# Patient Record
Sex: Female | Born: 1965
Health system: Southern US, Community
[De-identification: ages and names within clinical notes are randomized; demographics above are authoritative.]

## PROBLEM LIST (undated history)

## (undated) DIAGNOSIS — M1990 Unspecified osteoarthritis, unspecified site: Secondary | ICD-10-CM

## (undated) DIAGNOSIS — K219 Gastro-esophageal reflux disease without esophagitis: Secondary | ICD-10-CM

## (undated) DIAGNOSIS — Z8742 Personal history of other diseases of the female genital tract: Secondary | ICD-10-CM

## (undated) HISTORY — PX: CYST EXCISION: SHX5701

## (undated) HISTORY — DX: Gastro-esophageal reflux disease without esophagitis: K21.9

## (undated) HISTORY — DX: Unspecified osteoarthritis, unspecified site: M19.90

## (undated) HISTORY — PX: CHOLECYSTECTOMY: SHX55

## (undated) HISTORY — DX: Personal history of other diseases of the female genital tract: Z87.42

## (undated) HISTORY — PX: WISDOM TOOTH EXTRACTION: SHX21

---

## 1998-01-10 ENCOUNTER — Other Ambulatory Visit: Admission: RE | Admit: 1998-01-10 | Discharge: 1998-01-10 | Payer: Self-pay | Admitting: Obstetrics and Gynecology

## 1998-01-12 ENCOUNTER — Ambulatory Visit (HOSPITAL_COMMUNITY): Admission: RE | Admit: 1998-01-12 | Discharge: 1998-01-12 | Payer: Self-pay | Admitting: Obstetrics and Gynecology

## 1999-02-23 ENCOUNTER — Inpatient Hospital Stay (HOSPITAL_COMMUNITY): Admission: AD | Admit: 1999-02-23 | Discharge: 1999-02-26 | Payer: Self-pay | Admitting: Obstetrics and Gynecology

## 1999-03-12 ENCOUNTER — Encounter (HOSPITAL_COMMUNITY): Admission: RE | Admit: 1999-03-12 | Discharge: 1999-06-10 | Payer: Self-pay | Admitting: Obstetrics and Gynecology

## 2001-02-17 ENCOUNTER — Other Ambulatory Visit: Admission: RE | Admit: 2001-02-17 | Discharge: 2001-02-17 | Payer: Self-pay | Admitting: Obstetrics and Gynecology

## 2002-03-17 ENCOUNTER — Inpatient Hospital Stay (HOSPITAL_COMMUNITY): Admission: AD | Admit: 2002-03-17 | Discharge: 2002-03-19 | Payer: Self-pay | Admitting: Obstetrics and Gynecology

## 2002-05-03 ENCOUNTER — Other Ambulatory Visit: Admission: RE | Admit: 2002-05-03 | Discharge: 2002-05-03 | Payer: Self-pay | Admitting: Obstetrics and Gynecology

## 2003-05-24 ENCOUNTER — Other Ambulatory Visit: Admission: RE | Admit: 2003-05-24 | Discharge: 2003-05-24 | Payer: Self-pay | Admitting: Obstetrics and Gynecology

## 2004-09-29 ENCOUNTER — Other Ambulatory Visit: Admission: RE | Admit: 2004-09-29 | Discharge: 2004-09-29 | Payer: Self-pay | Admitting: Obstetrics and Gynecology

## 2006-02-26 ENCOUNTER — Other Ambulatory Visit: Admission: RE | Admit: 2006-02-26 | Discharge: 2006-02-26 | Payer: Self-pay | Admitting: Obstetrics and Gynecology

## 2010-08-29 ENCOUNTER — Emergency Department (HOSPITAL_COMMUNITY)
Admission: EM | Admit: 2010-08-29 | Discharge: 2010-08-29 | Payer: Self-pay | Source: Home / Self Care | Admitting: Emergency Medicine

## 2010-08-29 ENCOUNTER — Ambulatory Visit (HOSPITAL_COMMUNITY): Admission: AD | Admit: 2010-08-29 | Discharge: 2010-08-30 | Payer: Self-pay | Source: Home / Self Care

## 2010-08-30 ENCOUNTER — Encounter (INDEPENDENT_AMBULATORY_CARE_PROVIDER_SITE_OTHER): Payer: Self-pay

## 2010-11-17 LAB — COMPREHENSIVE METABOLIC PANEL
ALT: 12 U/L (ref 0–35)
Albumin: 4.1 g/dL (ref 3.5–5.2)
Alkaline Phosphatase: 51 U/L (ref 39–117)
Alkaline Phosphatase: 54 U/L (ref 39–117)
Calcium: 9.8 mg/dL (ref 8.4–10.5)
Chloride: 108 mEq/L (ref 96–112)
Creatinine, Ser: 0.71 mg/dL (ref 0.4–1.2)
GFR calc Af Amer: >60 mL/min (ref >60)
Glucose, Bld: 118 mg/dL — ABNORMAL HIGH (ref 70–99)
Sodium: 136 mEq/L (ref 135–145)
Total Bilirubin: 0.5 mg/dL (ref 0.3–1.2)
Total Protein: 7.3 g/dL (ref 6.0–8.3)

## 2010-11-17 LAB — URINALYSIS, ROUTINE W REFLEX MICROSCOPIC
Glucose, UA: NEGATIVE mg/dL
Hgb urine dipstick: NEGATIVE
Nitrite: NEGATIVE
Protein, ur: NEGATIVE mg/dL
Urobilinogen, UA: 0.2 mg/dL (ref 0.0–1.0)

## 2010-11-17 LAB — CBC
Hemoglobin: 12.3 g/dL (ref 12.0–15.0)
Hemoglobin: 13 g/dL (ref 12.0–15.0)
MCH: 31.1 pg (ref 26.0–34.0)
MCH: 31.9 pg (ref 26.0–34.0)
MCHC: 33.5 g/dL (ref 30.0–36.0)
MCHC: 34.1 g/dL (ref 30.0–36.0)
MCV: 93.5 fL (ref 78.0–100.0)
RBC: 4.18 MIL/uL (ref 3.87–5.11)
RDW: 13 % (ref 11.5–15.5)
WBC: 7.5 10*3/uL (ref 4.0–10.5)

## 2010-11-17 LAB — DIFFERENTIAL
Eosinophils Relative: 2 % (ref 0–5)
Lymphs Abs: 1.9 10*3/uL (ref 0.7–4.0)
Neutrophils Relative %: 66 % (ref 43–77)

## 2010-11-17 LAB — POCT PREGNANCY, URINE: Preg Test, Ur: NEGATIVE

## 2011-01-23 NOTE — Discharge Summary (Signed)
[facility]  Patient:    Julie Juarez, Julie Juarez Visit Number: [id number] MRN: [id number]          Service Type: OBS Location: 910A 9101 01 Attending Physician:  Esmeralda Arthur Dictated by:   Saverio Danker, C.N.M. Admit Date:  03/17/2002 Discharge Date: 03/19/2002                             Discharge Summary  ADMITTING DIAGNOSES:          1. Intrauterine pregnancy at term.                               2. Elevated blood pressures.                               3. Thrombocytopenia.                               4. Favorable cervix.  DISCHARGE DIAGNOSES:          1. Intrauterine pregnancy at term.                               2. Elevated blood pressures.                               3. Thrombocytopenia, resolving.                               4. Favorable cervix.                               5. Status post normal spontaneous vaginal                                  delivery.                               6. Breast-feeding.                               7. Plans Micronor for contraception.  PROCEDURE:                    Precipitous vaginal delivery with viable female infant named Beau, who weighed 5 pounds 11 ounces and had Apgars of 9 and 9, attended by Dr. Cherly Hensen, on March 17, 2002.  HOSPITAL COURSE:              Julie Juarez is a 45 year old, married, white female, gravida 3, para 1-0-1-1-1, at 37-1/7 week, admitted for induction secondary to elevated blood pressures and thrombocytopenia.  She did not have proteinuria, but her platelet count was 89.  Her uric acid was 6.2.  She was admitted and underwent AROM and started on Pitocin augmentation.  She progressed rapidly in labor from admission to delivery at 1543 p.m. precipitously and attended by another physician, Dr. Cherly Hensen, for a viable female infant  named Beau, who weighed 5 pounds 11 ounces and had Apgars of 9 and 9.  Please see operative note for details.  Postoperatively, the patient  was maintained on magnesium sulfate for 24 hours following delivery.  Her thrombocytopenia improved from 89 to 103.  The rest of her vital signs were stable.  She was afebrile and since discontinuing her magnesium, her blood pressures have remained stable at 102-120 systolically over 68-72 diastolically.  She has no headaches, blurred vision, or shortness of breath. She is breast-feeding without difficulty, and she desires Micronor for contraception.  She is deemed ready for discharge today.  DISCHARGE INSTRUCTIONS:       As per the [facility] handout.  DISCHARGE MEDICATIONS:        1. Motrin 600 mg p.o. q.6h. p.r.n. pain.                               2. Micronor 1 p.o. q.d. to start in about 2-3                                  weeks.  DISCHARGE LABORATORY DATA:    Her hemoglobin is 9.8, platelets 103.  AST is 24; ALT is 19, and her WBC count is 10.1.  DISCHARGE FOLLOW-UP:          In six weeks at [facility] or p.r.n. Dictated by:   Vance Gather Duplantis, C.N.M. Attending Physician:  Esmeralda Arthur DD:  03/19/02 TD:  03/21/02 Job: [id number]

## 2011-01-23 NOTE — H&P (Signed)
[facility]  Patient:    Julie Juarez, Julie Juarez Visit Number: [id number] MRN: [id number]          Service Type: OBS Location: 910B 9166 01 Attending Physician:  Esmeralda Arthur Dictated by:   Saverio Danker, C.N.M. Admit Date:  03/17/2002                           History and Physical  HISTORY OF PRESENT ILLNESS:   The patient is a 45 year old married white female, gravida 3, para 1-0-1-1 at 37-1/7 weeks.  She presents for induction of labor secondary to preeclampsia with thrombocytopenia.  She denies any nausea, vomiting, headaches or visual disturbances.  She has been recently having blood pressure elevations of 130 to 140s/90 since 34 weeks; these have been managed with rest at home.  However, today her platelet count is 89.  She reports occasional uterine contractions.  She denies any nausea, vomiting, headaches or visual disturbances.  She denies any leaking or vaginal bleeding. Her pregnancy has been followed at [facility] by the M.D. service, and has been essentially uncomplicated to date, other than the elevations in blood pressures since about 34 weeks.  Other history of significance is a history of dysplastic nevi, history of uterine prolapse, and history of frequent UTIs.  Group B strep is not currently available.  OB-GYN HISTORY:               She is a gravida 3, para 1-0-1-1 who had a miscarriage in 1999, with no complications.  She had a vaginal delivery in June 2000 of a viable female infant, weighing 6 pounds 14 ounces at [redacted] weeks gestation with an epidural.  She was attended in delivery by Dr. Estanislado Pandy.  OTHER GYN HISTORY:            History of uterine prolapse.  GENERAL MEDICAL HISTORY:      She has no known drug allergies.  She reports having had the usual childhood diseases.  She has occasional history of anemia in the past, occasional urinary tract infections.  PAST SURGICAL HISTORY:        Two impacted wisdom  teeth in 1989.  Cyst removed from her left shoulder in 1996.  FAMILY HISTORY:               Significant for father and paternal grandmother with hypertension.  Maternal grandmother with adult-onset diabetes (insulin-dependent).  Paternal grandfather with CVA.  First cousin with epilepsy.  GENETIC HISTORY:              Negative.  SOCIAL HISTORY:               She is married to [employer], who is involved and supportive.  They are of the [employer] [facility] faith.  They are both employed full time.  They deny any illicit drug use, alcohol or smoking with this pregnancy.  PRENATAL LABS:                Blood type A positive.  Antibody screen is negative.  Syphilis is nonreactive.  Rubella is positive.  Hepatitis B surface antigen negative.  GC and chlamydia both negative.  Pap smear within normal limits.  One-hour glucola within normal range.  PHYSICAL EXAMINATION:  VITAL SIGNS:                  Blood pressure 130-140/90.  She is afebrile.  HEENT:  Grossly within normal limits.  HEART:                        Regular rate and rhythm.  CHEST:                        Clear.  BREASTS:                      Soft and nontender.  ABDOMEN:                      Gravid with irregular, mild uterine contractions.  Her fetal heart rate is reactive and reassuring.  Her cervix on admission is 2-3, 50%, posterior vertex and -2.  EXTREMITIES:                  Within normal limits.  ASSESSMENT:                   1. Intrauterine pregnancy at term.                               2. PIH with thrombocytopenia.                               3. Favorable cervix.  PLAN:                         Admit to labor and delivery for induction of labor, per Dr. Dois Davenport Rivard. Dictated by:   Vance Gather Duplantis, C.N.M. Attending Physician:  Esmeralda Arthur DD:  03/17/02 TD:  03/17/02 Job: [id number]

## 2011-08-27 ENCOUNTER — Other Ambulatory Visit: Payer: Self-pay | Admitting: Obstetrics and Gynecology

## 2011-08-27 DIAGNOSIS — Z1231 Encounter for screening mammogram for malignant neoplasm of breast: Secondary | ICD-10-CM

## 2011-09-28 ENCOUNTER — Ambulatory Visit: Payer: Self-pay

## 2011-10-05 ENCOUNTER — Ambulatory Visit: Payer: Self-pay

## 2011-12-30 ENCOUNTER — Ambulatory Visit
Admission: RE | Admit: 2011-12-30 | Discharge: 2011-12-30 | Disposition: A | Discharge status: Home / Self Care | Payer: BC Managed Care – PPO | Source: Ambulatory Visit | Attending: Obstetrics and Gynecology | Admitting: Obstetrics and Gynecology

## 2011-12-30 DIAGNOSIS — Z1231 Encounter for screening mammogram for malignant neoplasm of breast: Secondary | ICD-10-CM

## 2012-01-04 ENCOUNTER — Other Ambulatory Visit: Payer: Self-pay | Admitting: Obstetrics and Gynecology

## 2012-01-04 DIAGNOSIS — R928 Other abnormal and inconclusive findings on diagnostic imaging of breast: Secondary | ICD-10-CM

## 2012-01-12 ENCOUNTER — Other Ambulatory Visit: Payer: Self-pay | Admitting: Obstetrics and Gynecology

## 2012-01-12 ENCOUNTER — Ambulatory Visit
Admission: RE | Admit: 2012-01-12 | Discharge: 2012-01-12 | Disposition: A | Discharge status: Home / Self Care | Payer: BC Managed Care – PPO | Source: Ambulatory Visit | Attending: Obstetrics and Gynecology | Admitting: Obstetrics and Gynecology

## 2012-01-12 DIAGNOSIS — R928 Other abnormal and inconclusive findings on diagnostic imaging of breast: Secondary | ICD-10-CM

## 2012-08-30 ENCOUNTER — Ambulatory Visit: Payer: Self-pay | Admitting: Obstetrics and Gynecology

## 2013-03-21 ENCOUNTER — Other Ambulatory Visit: Payer: Self-pay

## 2013-03-21 DIAGNOSIS — Z1231 Encounter for screening mammogram for malignant neoplasm of breast: Secondary | ICD-10-CM

## 2013-04-19 ENCOUNTER — Ambulatory Visit
Admission: RE | Admit: 2013-04-19 | Discharge: 2013-04-19 | Disposition: A | Discharge status: Home / Self Care | Payer: 59 | Source: Ambulatory Visit

## 2013-04-19 ENCOUNTER — Other Ambulatory Visit: Payer: Self-pay | Admitting: Obstetrics and Gynecology

## 2013-04-19 DIAGNOSIS — R928 Other abnormal and inconclusive findings on diagnostic imaging of breast: Secondary | ICD-10-CM

## 2013-04-19 DIAGNOSIS — Z1231 Encounter for screening mammogram for malignant neoplasm of breast: Secondary | ICD-10-CM

## 2013-05-09 ENCOUNTER — Ambulatory Visit
Admission: RE | Admit: 2013-05-09 | Discharge: 2013-05-09 | Disposition: A | Discharge status: Home / Self Care | Payer: 59 | Source: Ambulatory Visit | Attending: Obstetrics and Gynecology | Admitting: Obstetrics and Gynecology

## 2013-05-09 DIAGNOSIS — R928 Other abnormal and inconclusive findings on diagnostic imaging of breast: Secondary | ICD-10-CM

## 2014-11-30 ENCOUNTER — Other Ambulatory Visit: Payer: Self-pay

## 2014-11-30 DIAGNOSIS — Z1231 Encounter for screening mammogram for malignant neoplasm of breast: Secondary | ICD-10-CM

## 2014-12-12 ENCOUNTER — Ambulatory Visit: Payer: Self-pay

## 2014-12-24 ENCOUNTER — Ambulatory Visit: Payer: Self-pay

## 2015-01-28 ENCOUNTER — Ambulatory Visit
Admission: RE | Admit: 2015-01-28 | Discharge: 2015-01-28 | Disposition: A | Discharge status: Home / Self Care | Payer: 59 | Source: Ambulatory Visit

## 2015-01-28 DIAGNOSIS — Z1231 Encounter for screening mammogram for malignant neoplasm of breast: Secondary | ICD-10-CM

## 2016-03-19 ENCOUNTER — Other Ambulatory Visit: Payer: Self-pay | Admitting: Obstetrics and Gynecology

## 2016-03-19 DIAGNOSIS — Z1231 Encounter for screening mammogram for malignant neoplasm of breast: Secondary | ICD-10-CM

## 2016-03-25 ENCOUNTER — Ambulatory Visit
Admission: RE | Admit: 2016-03-25 | Discharge: 2016-03-25 | Disposition: A | Discharge status: Home / Self Care | Payer: 59 | Source: Ambulatory Visit | Attending: Obstetrics and Gynecology | Admitting: Obstetrics and Gynecology

## 2016-03-25 DIAGNOSIS — Z1231 Encounter for screening mammogram for malignant neoplasm of breast: Secondary | ICD-10-CM

## 2017-03-23 DIAGNOSIS — L821 Other seborrheic keratosis: Secondary | ICD-10-CM | POA: Diagnosis not present

## 2017-03-23 DIAGNOSIS — D225 Melanocytic nevi of trunk: Secondary | ICD-10-CM | POA: Diagnosis not present

## 2017-03-23 DIAGNOSIS — D2261 Melanocytic nevi of right upper limb, including shoulder: Secondary | ICD-10-CM | POA: Diagnosis not present

## 2017-04-21 DIAGNOSIS — Z6823 Body mass index (BMI) 23.0-23.9, adult: Secondary | ICD-10-CM | POA: Diagnosis not present

## 2017-04-21 DIAGNOSIS — Z0000 Encounter for general adult medical examination without abnormal findings: Secondary | ICD-10-CM | POA: Diagnosis not present

## 2017-04-21 DIAGNOSIS — Z1389 Encounter for screening for other disorder: Secondary | ICD-10-CM | POA: Diagnosis not present

## 2017-07-07 DIAGNOSIS — Z23 Encounter for immunization: Secondary | ICD-10-CM | POA: Diagnosis not present

## 2017-12-31 IMAGING — MG 2D DIGITAL SCREENING BILATERAL MAMMOGRAM WITH CAD AND ADJUNCT TO
9 of 13 series · 9 of 29 positions shown · non-contrast
Comparison: Previous exam(s).

CLINICAL DATA: Screening.

EXAM:
2D DIGITAL SCREENING BILATERAL MAMMOGRAM WITH CAD AND ADJUNCT TOMO

[L XCCL]
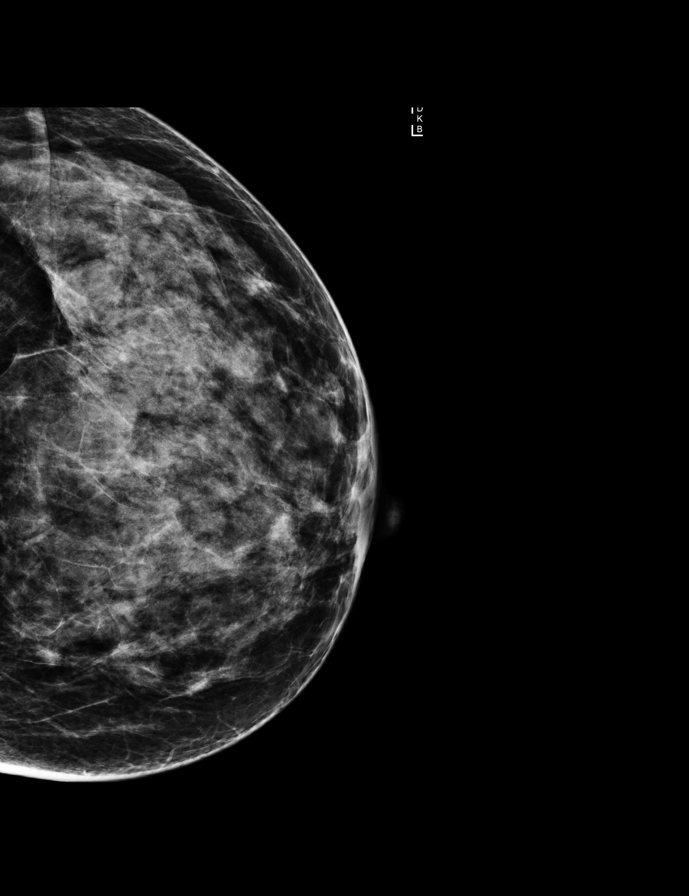

[L CC synth-2D]
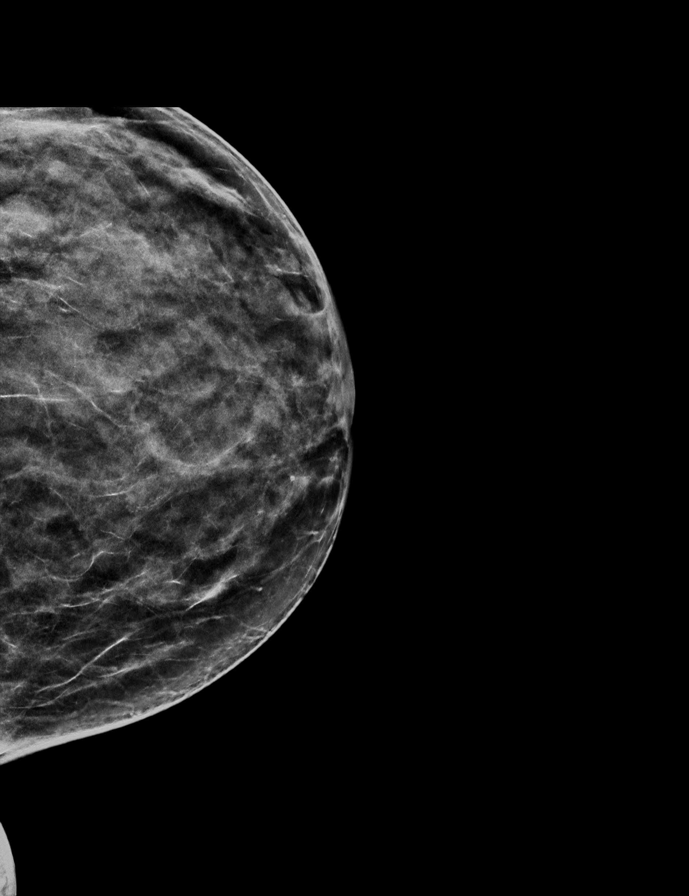

[L MLO]
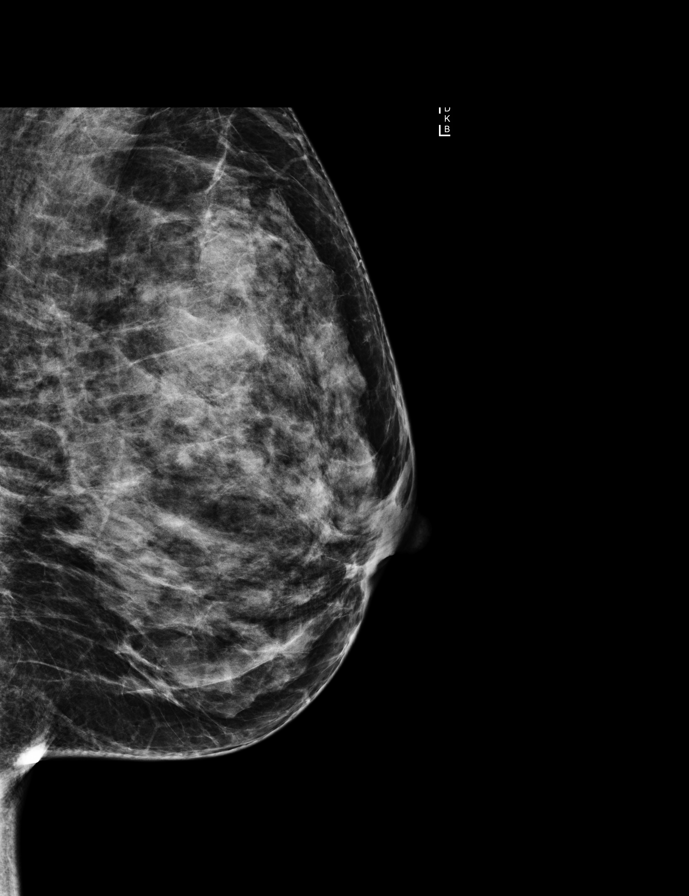

[R CC]
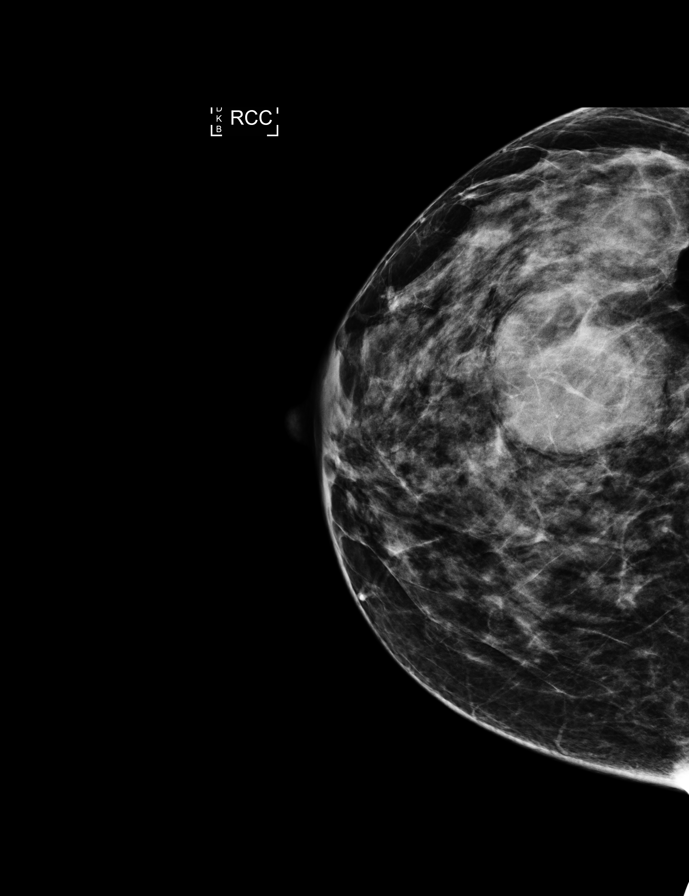

[L MLO synth-2D]
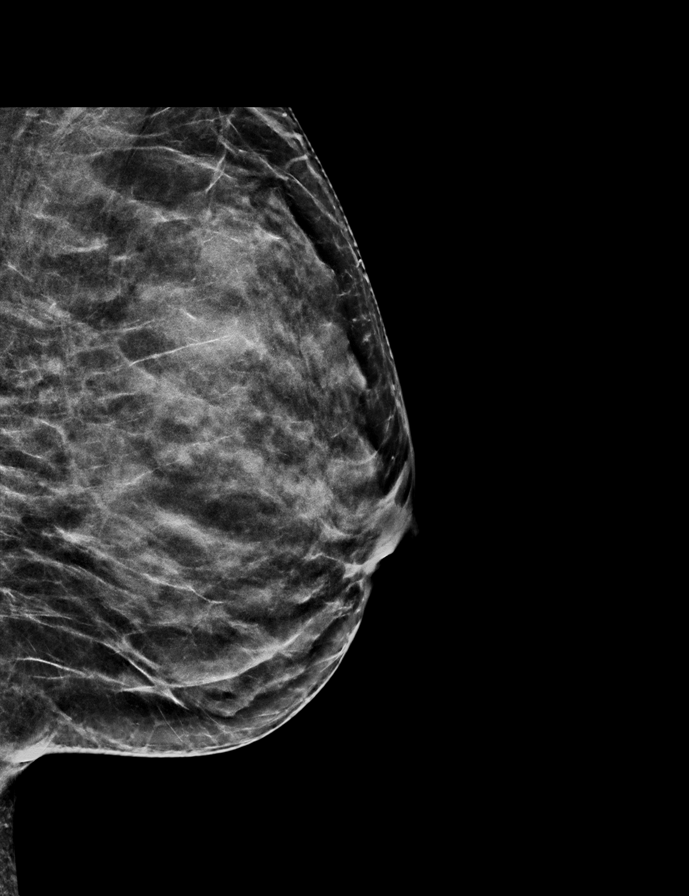

[L CC]
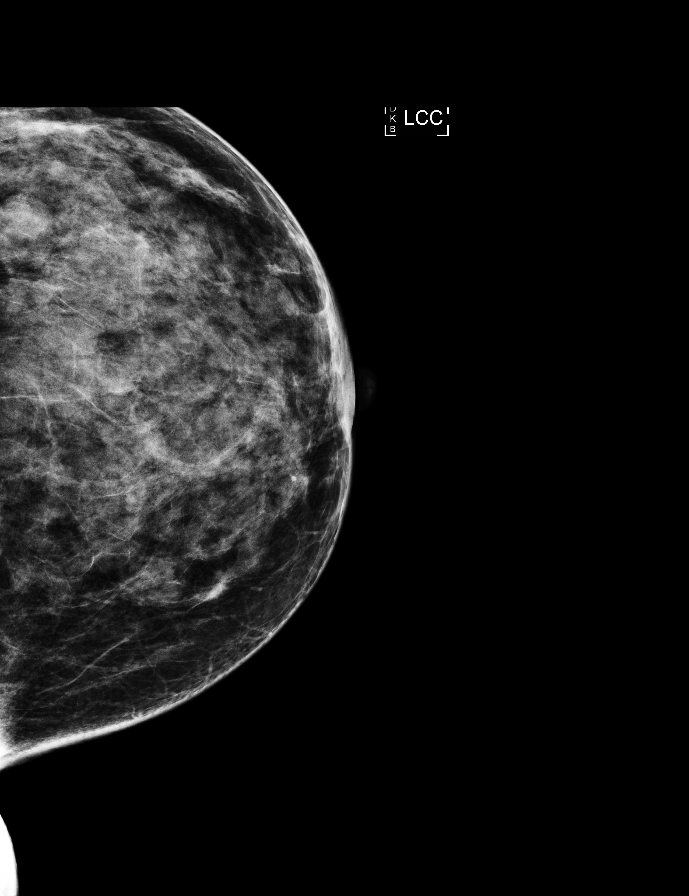

[R MLO]
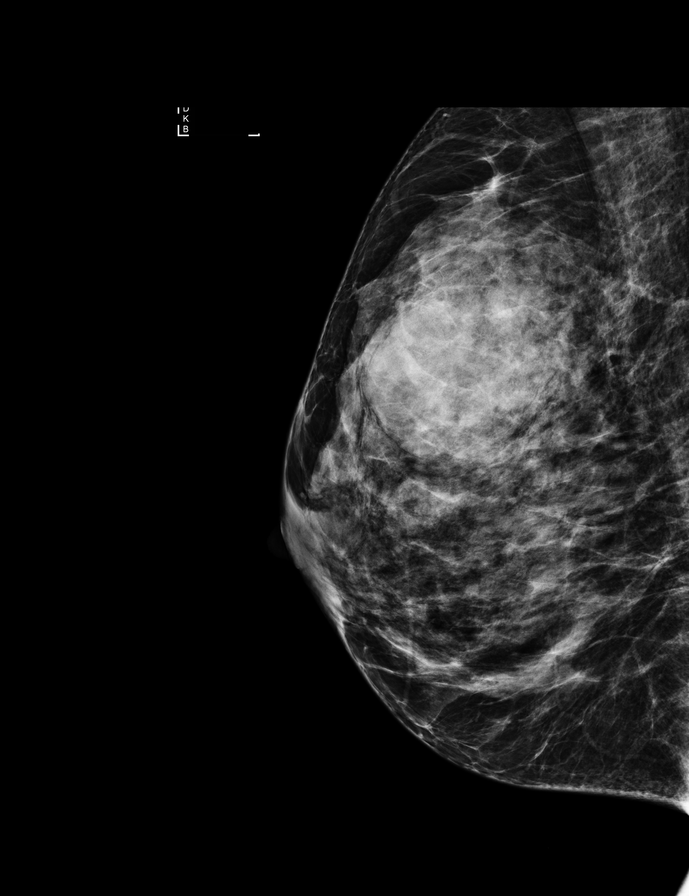

[R MLO synth-2D]
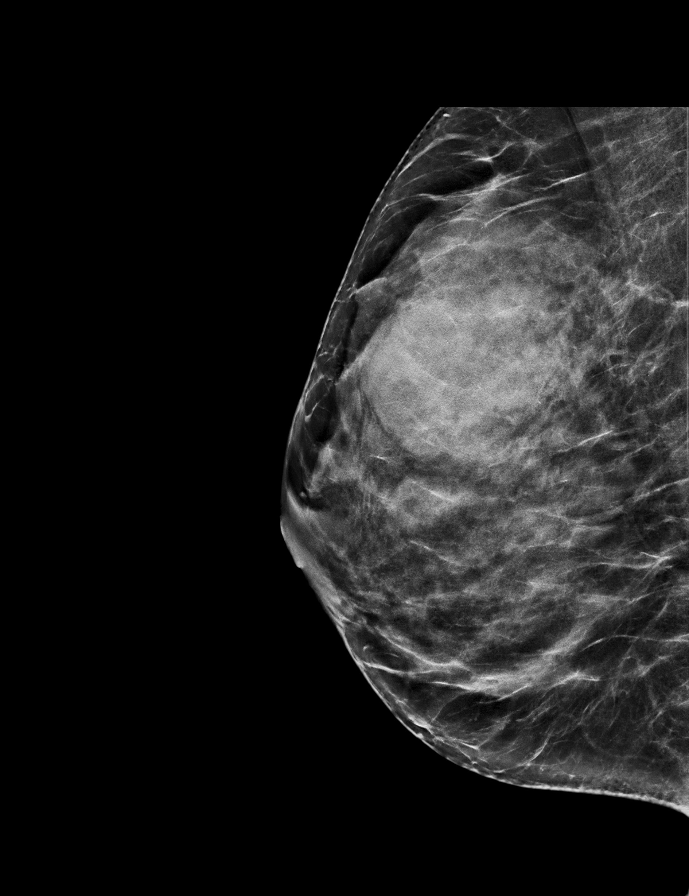

[R CC synth-2D]
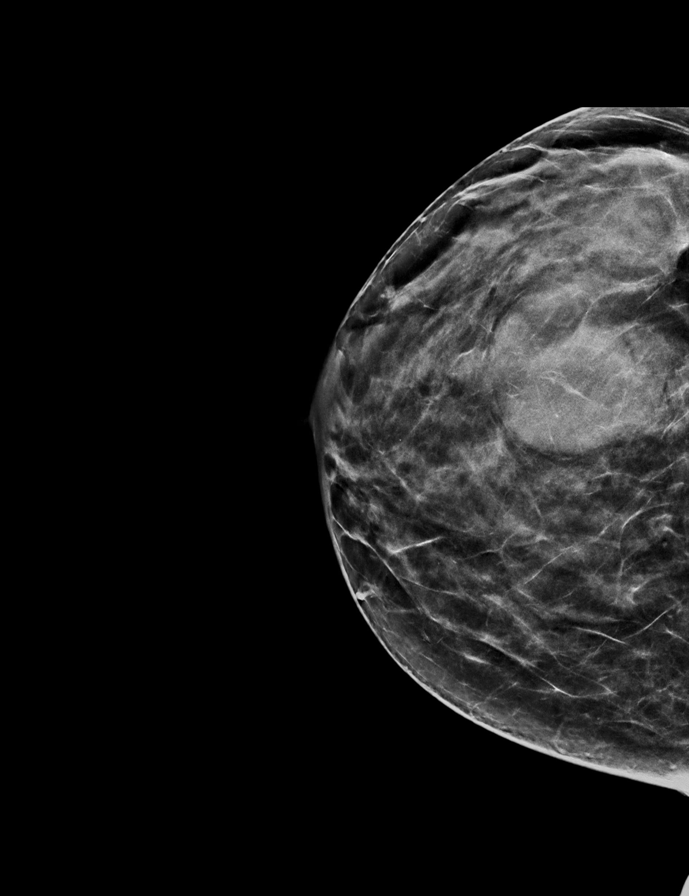

[9 of 29 positions shown; findings below may reference images not displayed]

ACR Breast Density Category d: The breast tissue is extremely dense,
which lowers the sensitivity of mammography.
FINDINGS: There are no findings suspicious for malignancy. Images were
processed with CAD.
IMPRESSION: Fluctuating bilateral well-circumscribed masses are consistent with
cysts. No mammographic evidence of malignancy. A result letter of
this screening mammogram will be mailed directly to the patient.

RECOMMENDATION:
Screening mammogram in one year. (Code:L0-T-GYZ)

BI-RADS CATEGORY  1: Negative.

## 2018-01-14 DIAGNOSIS — D225 Melanocytic nevi of trunk: Secondary | ICD-10-CM | POA: Diagnosis not present

## 2018-01-14 DIAGNOSIS — D2261 Melanocytic nevi of right upper limb, including shoulder: Secondary | ICD-10-CM | POA: Diagnosis not present

## 2018-01-14 DIAGNOSIS — D2262 Melanocytic nevi of left upper limb, including shoulder: Secondary | ICD-10-CM | POA: Diagnosis not present

## 2018-08-24 ENCOUNTER — Ambulatory Visit: Payer: 59 | Admitting: Family Medicine

## 2018-08-25 ENCOUNTER — Encounter: Payer: Self-pay | Admitting: Family Medicine

## 2018-08-25 ENCOUNTER — Ambulatory Visit: Payer: 59 | Admitting: Family Medicine

## 2018-08-25 VITALS — BP 131/87 | HR 62 | Temp 98.6°F | Ht 62.0 in | Wt 137.4 lb

## 2018-08-25 DIAGNOSIS — R7309 Other abnormal glucose: Secondary | ICD-10-CM | POA: Diagnosis not present

## 2018-08-25 DIAGNOSIS — E782 Mixed hyperlipidemia: Secondary | ICD-10-CM | POA: Diagnosis not present

## 2018-08-25 DIAGNOSIS — Z23 Encounter for immunization: Secondary | ICD-10-CM | POA: Diagnosis not present

## 2018-08-25 DIAGNOSIS — N814 Uterovaginal prolapse, unspecified: Secondary | ICD-10-CM | POA: Insufficient documentation

## 2018-08-25 DIAGNOSIS — Z7689 Persons encountering health services in other specified circumstances: Secondary | ICD-10-CM

## 2018-08-25 DIAGNOSIS — Z8742 Personal history of other diseases of the female genital tract: Secondary | ICD-10-CM | POA: Insufficient documentation

## 2018-08-25 DIAGNOSIS — R739 Hyperglycemia, unspecified: Secondary | ICD-10-CM | POA: Insufficient documentation

## 2018-08-25 NOTE — Progress Notes (Signed)
New patient office visit note:  Impression and Recommendations:    1. Encounter to establish care with new doctor   2. Elevated random blood glucose level   3. Moderate mixed hyperlipidemia not requiring statin therapy     1. Encounter to Establish Care with New Doctor - Extensive discussion held with patient regarding establishing as a new patient.  Discussed policies and practices here at the clinic, and answered all questions about care team and health management during appointment.  - Discussed need for patient to continue to obtain up-to-date screenings with all established specialists.  - Reviewed need to obtain fasting lab work in the near future.  2. BMI Counseling Explained to patient what BMI refers to, and what it means medically.    Told patient to think about it as a "medical risk stratification measurement" and how increasing BMI is associated with increasing risk/ or worsening state of various diseases such as hypertension, hyperlipidemia, diabetes, premature OA, depression etc.  American Heart Association guidelines for healthy diet, basically Mediterranean diet, and exercise guidelines of 30 minutes 5 days per week or more discussed in detail.  Health counseling performed.  All questions answered.  3. Lifestyle & Preventative Health Maintenance - Advised patient to continue working toward exercising to improve overall mental, physical, and emotional health.    - Reviewed the "spokes of the wheel" of mood and health management.  Stressed the importance of ongoing prudent habits, including regular exercise, appropriate sleep hygiene, healthful dietary habits, and prayer/meditation to relax.  - Encouraged patient to engage in daily physical activity, especially a formal exercise routine.  Recommended that the patient eventually strive for at least 150 minutes of moderate cardiovascular activity per week according to guidelines established by the [facility].   - Healthy  dietary habits encouraged, including low-carb, and high amounts of lean protein in diet.   - Patient should also consume adequate amounts of water.    Education and routine counseling performed. Handouts provided.  Expresses verbal understanding and consents to current therapy plan and treatment regimen.  Return for CPE next avail; come fasting for bldwrk same day.  Please see AVS handed out to patient at the end of our visit for further patient instructions/ counseling done pertaining to today's office visit.    Note:  This document was prepared using Dragon voice recognition software and may include unintentional dictation errors.  This document serves as a record of services personally performed by Mellody Dance, DO. It was created on her behalf by Toni Amend, a trained medical scribe. The creation of this record is based on the scribe's personal observations and the provider's statements to them.   I have reviewed the above medical documentation for accuracy and completeness and I concur.  Mellody Dance, DO 08/25/2018 4:57 PM      ----------------------------------------------------------------------------------------------------------------------------------    Subjective:    Chief complaint:   Chief Complaint  Patient presents with  . Annual Exam  . Establish Care     HPI: Verne Cove is a pleasant 52 y.o. female who presents to [place] [facility] today to review their medical history with me and establish care.   I asked the patient to review their chronic problem list with me to ensure everything was updated and accurate.    All recent office visits with other providers, any medical records that patient brought in etc  - I reviewed today.     We asked pt to get [place]  their medical records from ALL providers/ specialists that they had seen within the past 3-5 years- if they are in private practice and/or do not work for [employer], [facility], [place], [place] or [employer] owned practice.  Told them to call their specialists to clarify this if they are not sure.    Was recommended to establish here from a couple of friends that had started here.  Has been with [facility] in [place] for many years.  Was seeing Dr. Lisbeth Ply prior.  She, her daughter (age 83) and son (age 79) are all planning to establish here.  Wants to make sure she's taking care of her preventative maintenance.  Social History Works for [employer]; has been working there for 5 years. Works about 50 hours per week.  "I'm supposed to work 43 but that doesn't happen."  Monogamous with husband.  Tobacco Use Never smoker.  Alcohol Use Non-drinker.  No drug use.  Used to drink coffee, but doesn't enjoy it as much recently.   Drinking a lot of tea, mostly decaf.  Has caffeine in the form of soda.  In the last few months, hasn't enjoyed coffee.  Family History Father with high cholesterol. Dad had skin cancer. Father has diabetes, diagnosed in 7's or 8's.  Mother is alive and healthy.  Grandmother was in her 57's with heart attack.  Uncle committed suicide, dad's side. He was probably in his 27's.  History of onset of diabetes in age 40's.  Maternal grandfather died of stroke, in 32's. Maternal grandmother died of heart attack.  Maternal grandmother died of natural causes.  Both grandfathers had diabetes in their 71's and died of complications.   Surgical History Past Surgical History:  Procedure Laterality Date  . CHOLECYSTECTOMY    . CYST EXCISION     FROM LEFT SHOULDER  . WISDOM TOOTH EXTRACTION      Past Medical History Last had labs about a year or so ago.  Years ago, "I think my cholesterol was a little high, but no one has said anything about that since."  Denies HTN, reflux.  Denies allergies or asthma.  Denies rashes.  History of Elevated Blood Sugar Last time she went for a physical, she  was told that her sugar was slightly elevated.  Patient doesn't remember what her A1c was, and "doesn't know the number."  Does not remember hearing the word prediabetes; just remembers "elevated blood sugar" and the impression to watch it.  OBGYN Follow-Up Patient has an OBGYN that she needs to follow up with. Last saw the OBGYN in 2016; last had a mammogram in 2017.  Sees Delsa Bern at [facility].  She's still getting her period.   Wt Readings from Last 3 Encounters:  08/25/18 137 lb 6.4 oz (62.3 kg)   BP Readings from Last 3 Encounters:  08/25/18 131/87   Pulse Readings from Last 3 Encounters:  08/25/18 62   BMI Readings from Last 3 Encounters:  08/25/18 25.13 kg/m    Patient Care Team    Relationship Specialty Notifications Start End  Mellody Dance, DO PCP - General Family Medicine  08/25/18   Sydnee Levans, MD Referring Physician Dermatology  08/25/18   Phylliss Blakes, OD  Optometry  08/25/18     Patient Active Problem List   Diagnosis Date Noted  . H/O uterine prolapse 08/25/2018  . Uterine prolapse 08/25/2018  . Moderate mixed hyperlipidemia not requiring statin therapy 08/25/2018  . Elevated random blood glucose  level 08/25/2018       As reported by pt:  Past Medical History:  Diagnosis Date  . H/O uterine prolapse      Past Surgical History:  Procedure Laterality Date  . CHOLECYSTECTOMY    . CYST EXCISION     FROM LEFT SHOULDER  . WISDOM TOOTH EXTRACTION       Family History  Problem Relation Age of Onset  . Hypertension Father   . Skin cancer Father   . Hyperlipidemia Father   . Hypertension Paternal Grandmother   . Heart attack Paternal Grandmother   . CVA Paternal Grandfather   . Diabetes Paternal Grandfather   . Stroke Paternal Grandfather   . Epilepsy Cousin   . Diabetes Maternal Grandfather      Social History   Substance and Sexual Activity  Drug Use Never     Social History   Substance and Sexual  Activity  Alcohol Use No     Social History   Tobacco Use  Smoking Status Never Smoker  Smokeless Tobacco Never Used     Current Meds  Medication Sig  . Cholecalciferol (VITAMIN D3) 25 MCG (1000 UT) CAPS Take 1 capsule by mouth daily.  . Cyanocobalamin (VITAMIN B12) 500 MCG TABS Take 1 tablet by mouth daily.  . vitamin C (ASCORBIC ACID) 500 MG tablet Take 500 mg by mouth daily.    Allergies: Patient has no known allergies.   Review of Systems  Constitutional: Negative for chills, diaphoresis, fever, malaise/fatigue and weight loss.  HENT: Negative for congestion, sore throat and tinnitus.   Eyes: Negative for blurred vision, double vision and photophobia.  Respiratory: Negative for cough and wheezing.   Cardiovascular: Negative for chest pain and palpitations.  Gastrointestinal: Negative for blood in stool, diarrhea, nausea and vomiting.  Genitourinary: Negative for dysuria, frequency and urgency.  Musculoskeletal: Negative for joint pain and myalgias.  Skin: Negative for itching and rash.  Neurological: Negative for dizziness, focal weakness, weakness and headaches.  Endo/Heme/Allergies: Negative for environmental allergies and polydipsia. Does not bruise/bleed easily.  Psychiatric/Behavioral: Negative for depression and memory loss. The patient is not nervous/anxious and does not have insomnia.         Objective:   Blood pressure 131/87, pulse 62, temperature 98.6 F (37 C), height 5\' 2"  (1.575 m), weight 137 lb 6.4 oz (62.3 kg), SpO2 100 %. Body mass index is 25.13 kg/m. General: Well Developed, well nourished, and in no acute distress.  Neuro: Alert and oriented x3, extra-ocular muscles intact, sensation grossly intact.  HEENT:[place]/AT, PERRLA, neck supple, No carotid bruits Skin: no gross rashes  Cardiac: Regular rate and rhythm Respiratory: Essentially clear to auscultation bilaterally. Not using accessory muscles, speaking in full sentences.  Abdominal: not  grossly distended Musculoskeletal: Ambulates w/o diff, FROM * 4 ext.  Vasc: less 2 sec cap RF, warm and pink  Psych:  No HI/SI, judgement and insight good, Euthymic mood. Full Affect.    No results found for this or any previous visit (from the past 2160 hour(s)).

## 2018-08-25 NOTE — Patient Instructions (Signed)

## 2018-08-26 NOTE — Addendum Note (Signed)
Addended by: Lanier Prude D on: 08/26/2018 02:01 PM   Modules accepted: Orders

## 2018-09-29 ENCOUNTER — Ambulatory Visit (INDEPENDENT_AMBULATORY_CARE_PROVIDER_SITE_OTHER): Payer: 59 | Admitting: Family Medicine

## 2018-09-29 ENCOUNTER — Encounter: Payer: Self-pay | Admitting: Family Medicine

## 2018-09-29 VITALS — BP 116/81 | HR 65 | Temp 98.4°F | Ht 62.5 in | Wt 138.6 lb

## 2018-09-29 DIAGNOSIS — E538 Deficiency of other specified B group vitamins: Secondary | ICD-10-CM | POA: Diagnosis not present

## 2018-09-29 DIAGNOSIS — Z1211 Encounter for screening for malignant neoplasm of colon: Secondary | ICD-10-CM | POA: Diagnosis not present

## 2018-09-29 DIAGNOSIS — B009 Herpesviral infection, unspecified: Secondary | ICD-10-CM

## 2018-09-29 DIAGNOSIS — Z0000 Encounter for general adult medical examination without abnormal findings: Secondary | ICD-10-CM

## 2018-09-29 MED ORDER — VALACYCLOVIR HCL 500 MG PO TABS: 500.0000 mg | ORAL_TABLET | Freq: Two times a day (BID) | ORAL | 5 refills | Status: AC

## 2018-09-29 NOTE — Progress Notes (Signed)
Impression and Recommendations:    1. Healthcare maintenance   2. Screening for colon cancer   3. 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 Dietary B12 deficiency     - Need for blood work today.  1) Anticipatory Guidance: Discussed importance of wearing a seatbelt while driving, not texting while driving; sunscreen when outside along with yearly skin surveillance; eating a well balanced and modest diet; physical activity at least 25 minutes per day or 150 min/ week of moderate to intense activity.  - Discussed and reviewed prudent skin screening habits with patient today.  2) Immunizations / Screenings / Labs:  All immunizations and screenings that patient agrees to, are up-to-date per recommendations or will be updated today.  Patient understands the needs for q 44mo dental and yearly vision screens which pt will schedule independently. Obtain CBC, CMP, HgA1c, Lipid panel, TSH and vit D when fasting if not already done recently.   - Extensive discussion held with patient regarding need to continue to obtain management and screenings with all established specialists.  Educated patient at length about the critical importance of keeping health maintenance up to date.  - Participated in lengthy conversation about recommendations.  All questions were answered.  - Continue to follow up for OBGYN management with Dr. Delsa Bern of [facility].  Encouraged patient to obtain screenings as recommended (mammogram, pap smear).  - Patient will have copies of past screening results sent from OBGYN to PCP.  - Continue to follow up with dermatologist Dr. Fontaine No as established.  - Need for colonoscopy this year.  Information provided today.  - Need for shingles vaccine.  Information provided today.  3) BMI Counseling - BMI of 24.95 kg/m Explained to patient what BMI refers to, and what it means medically.    Told patient to think about it as a "medical  risk stratification measurement" and how increasing BMI is associated with increasing risk/ or worsening state of various diseases such as hypertension, hyperlipidemia, diabetes, premature OA, depression etc.  American Heart Association guidelines for healthy diet, basically Mediterranean diet, and exercise guidelines of 30 minutes 5 days per week or more discussed in detail.  - Discussed that patient should aim to maintain her current weight, if not lose a little weight.  - Improve nutrient density of diet through increasing intake of fruits and vegetables and decreasing saturated/trans fats, white flour products and refined sugar products.   Health counseling performed.  All questions answered.  4) Lifestyle & Preventative Health Maintenance - Advised patient to continue working toward exercising to improve overall mental, physical, and emotional health.    - Reviewed the "spokes of the wheel" of mood and health management.  Stressed the importance of ongoing prudent habits, including regular exercise, appropriate sleep hygiene, healthful dietary habits, and prayer/meditation to relax.  - Encouraged patient to engage in daily physical activity, especially a formal exercise routine.  Recommended that the patient eventually strive for at least 150 minutes of moderate cardiovascular activity per week according to guidelines established by the [facility].   - Healthy dietary habits encouraged, including low-carb, and high amounts of lean protein in diet.   - Patient should also consume adequate amounts of water.    Meds ordered this encounter  Medications  . valACYclovir (VALTREX) 500 MG tablet    Sig: Take 1 tablet (500 mg total) by mouth 2 (two) times daily for 3 days.    Dispense:  6 tablet  Refill:  5    Orders Placed This Encounter  Procedures  . CBC with Differential/Platelet  . Comprehensive metabolic panel  . Hemoglobin A1c  . Lipid panel  . T4, free  . TSH  . VITAMIN D 25  Hydroxy (Vit-D Deficiency, Fractures)  . Vitamin B12  . Ambulatory referral to Gastroenterology    Gross side effects, risk and benefits, and alternatives of medications discussed with patient.  Patient is aware that all medications have potential side effects and we are unable to predict every side effect or drug-drug interaction that may occur.  Expresses verbal understanding and consents to current therapy plan and treatment regimen.  F-up preventative CPE in 1 year. F/up sooner for chronic care management as discussed and/or prn.  Please see orders placed and AVS handed out to patient at the end of our visit for further patient instructions/ counseling done pertaining to today's office visit.  This document serves as a record of services personally performed by Mellody Dance, DO. It was created on her behalf by Toni Amend, a trained medical scribe. The creation of this record is based on the scribe's personal observations and the provider's statements to them.   I have reviewed the above medical documentation for accuracy and completeness and I concur.  Mellody Dance, DO 10/02/2018 9:03 PM       Subjective:    Chief Complaint  Patient presents with  . Annual Exam   CC:   HPI: Julie Juarez is a 53 y.o. female who presents to [facility] today a yearly health maintenance exam.  Health Maintenance Summary Reviewed and updated, unless pt declines services.  Colonoscopy:  Need for first colonoscopy this year. Tobacco History Reviewed:  Y; never smoker. Alcohol:  Does not drink. Exercise Habits:  Not meeting AHA guidelines. STD concerns:  None; monogamous with husband. Drug Use:  None. Birth control method:  Husband with vasectomy. Menses regular:  Yes, continues having periods. Lumps or breast concerns:  No. Breast Cancer Family History:  No.  [name] history of breast or colon cancer in first-degree relative.  Female Health For  OBGYN, patient follows up with Dr. [name] of [facility].   Visual Health Has a yearly eye exam for her glasses/contacts.  Dental Health Goes to the dentist every 6 months, Dr. Sharlett Iles over in [place] ([facility]).  Dermatological Health Has not noticed any skin changes.  Goes to the dermatologist yearly, Sydnee Levans.  States her dad has had places removed, so that's why she makes sure to go, but so far everything has been fine.  Cold Sores At end of appointment, patient notes that she sometimes struggles with cold sores.  States she has an outbreak three times per year.    Immunization History  Administered Date(s) Administered  . Influenza,inj,Quad PF,6+ Mos 08/25/2018  . Influenza-Unspecified 08/25/2018  . Tdap 01/30/2010    Health Maintenance  Topic Date Due  . PAP SMEAR-Modifier  12/31/1988  . MAMMOGRAM  08/26/2019 (Originally 03/25/2018)  . COLONOSCOPY  08/26/2019 (Originally 12/31/2017)  . HIV Screening  08/26/2019 (Originally 01/01/1983)  . TETANUS/TDAP  01/31/2020  . INFLUENZA VACCINE  Completed     Wt Readings from Last 3 Encounters:  09/29/18 138 lb 9.6 oz (62.9 kg)  08/25/18 137 lb 6.4 oz (62.3 kg)   BP Readings from Last 3 Encounters:  09/29/18 116/81  08/25/18 131/87   Pulse Readings from Last 3 Encounters:  09/29/18 65  08/25/18 62  Past Medical History:  Diagnosis Date  . H/O uterine prolapse       Past Surgical History:  Procedure Laterality Date  . CHOLECYSTECTOMY    . CYST EXCISION     FROM LEFT SHOULDER  . WISDOM TOOTH EXTRACTION        Family History  Problem Relation Age of Onset  . Hypertension Father   . Skin cancer Father   . Hyperlipidemia Father   . Hypertension Paternal Grandmother   . Heart attack Paternal Grandmother   . CVA Paternal Grandfather   . Diabetes Paternal Grandfather   . Stroke Paternal Grandfather   . Epilepsy Cousin   . Diabetes Maternal  Grandfather       Social History   Substance and Sexual Activity  Drug Use Never  ,   Social History   Substance and Sexual Activity  Alcohol Use No  ,   Social History   Tobacco Use  Smoking Status Never Smoker  Smokeless Tobacco Never Used  ,   Social History   Substance and Sexual Activity  Sexual Activity Yes   Comment: VASECTOMY    Current Outpatient Medications on File Prior to Visit  Medication Sig Dispense Refill  . Cholecalciferol (VITAMIN D3) 25 MCG (1000 UT) CAPS Take 1 capsule by mouth daily.    . Cyanocobalamin (VITAMIN B12) 500 MCG TABS Take 1 tablet by mouth daily.    . vitamin C (ASCORBIC ACID) 500 MG tablet Take 500 mg by mouth daily.     No current facility-administered medications on file prior to visit.     Allergies: Patient has no known allergies.  Review of Systems: General:   Denies fever, chills, unexplained weight loss.  Optho/Auditory:   Denies visual changes, blurred vision/LOV Respiratory:   Denies SOB, DOE more than baseline levels.  Cardiovascular:   Denies chest pain, palpitations, new onset peripheral edema  Gastrointestinal:   Denies nausea, vomiting, diarrhea.  Genitourinary: Denies dysuria, freq/ urgency, flank pain or discharge from genitals.  Endocrine:     Denies hot or cold intolerance, polyuria, polydipsia. Musculoskeletal:   Denies unexplained myalgias, joint swelling, unexplained arthralgias, gait problems.  Skin:  Denies rash, suspicious lesions Neurological:     Denies dizziness, unexplained weakness, numbness  Psychiatric/Behavioral:   Denies mood changes, suicidal or homicidal ideations, hallucinations    Objective:    Blood pressure 116/81, pulse 65, temperature 98.4 F (36.9 C), height 5' 2.5" (1.588 m), weight 138 lb 9.6 oz (62.9 kg), last menstrual period 09/15/2018, SpO2 99 %. Body mass index is 24.95 kg/m. General Appearance:    Alert, cooperative, no distress, appears stated age  Head:     Normocephalic, without obvious abnormality, atraumatic  Eyes:    PERRL, conjunctiva/corneas clear, EOM's intact, fundi    benign, both eyes  Ears:    Normal TM's and external ear canals, both ears  Nose:   Nares normal, septum midline, mucosa normal, no drainage    or sinus tenderness  Throat:   Lips w/o lesion, mucosa moist, and tongue normal; teeth and   gums normal  Neck:   Supple, symmetrical, trachea midline, no adenopathy;    thyroid:  no enlargement/tenderness/nodules; no carotid   bruit or JVD  Back:     Symmetric, no curvature, ROM normal, no CVA tenderness  Lungs:     Clear to auscultation bilaterally, respirations unlabored, no       Wh/ R/ R  Chest Wall:    No tenderness or gross deformity;  normal excursion   Heart:    Regular rate and rhythm, S1 and S2 normal, no murmur, rub   or gallop  Breast Exam:    Deferred to OBGYN.  Abdomen:     Soft, non-tender, bowel sounds active all four quadrants, NO   G/R/R, no masses, no organomegaly  Genitalia:    Deferred to OBGYN.  Rectal:    Deferred to OBGYN.  Extremities:   Extremities normal, atraumatic, no cyanosis or gross edema  Pulses:   2+ and symmetric all extremities  Skin:   Warm, dry, Skin color, texture, turgor normal, no obvious rashes or lesions Psych: No HI/SI, judgement and insight good, Euthymic mood. Full Affect.  Neurologic:   CNII-XII intact, normal strength, sensation and reflexes    Throughout

## 2018-09-29 NOTE — Patient Instructions (Addendum)
If GYN does not have results of your HIV and Hepatitis C screening, call us on Tuesday and we can add it on to your recent labs.       Thank you for your time and patience today.  I hope you found your office visit with me encouraging and educational, as my goal is to help my patients become healthier and happier.  We will call or contact you via MyChart or phone call in the next couple of days if labs are critically abnormal and need to be addressed immediately.  Otherwise you will usually hear from [place] within 1 week or so via phone call or MyChart message.  Lastly, please note, if you have an active MyChart account, even if you are not using it and checking it regularly, this is how we will be contacting you regarding lab results, test results etc. So please remember to check your account 1 to 2 weeks after blood work or tests that we ordered is completed.    If you do not want to be emailed results and wish to be called instead, you will need to cancel the MyChart account.   Tyler at our front desk can do that for you.  If you need medication refills, please be sure to call your pharmacy to request them.   This is the fastest and easiest way for you to obtain refills.   However, if a refill is not appropriate, one of our staff members or the pharmacy should contact you to let you know why it was denied.  Also, once you receive your after visit summary at the end of your appointment, please CAREFULLY and THOROUGHLY read your current medication list.    If you find inaccurate information, or if you take your medications differently than what is documented (even over-the-counter ones) please be sure to call the office and speak to one of our medical assistants about it so this can be corrected in your chart.    Please note that you may receive a survey via email or snail mail about your experience today.  Please feel free to be open and honest about your experiences with [place], as your input is valuable  to [place] and will help [place] to improve our care and services.  Otherwise, please feel free to do a google review on [facility] or go to [email or web address] and click on the FACEBOOK link to leave your thoughts.  If you have any further questions or concerns please do not hesitate to contact us.   Thank you and we appreciate you choosing [place] as your primary care provider!    - Dr. Raliegh Scarlet and 'the Team' at [facility], Female  A healthy lifestyle and preventive care can promote health and wellness. Preventive health guidelines for women include the following key practices.   A routine yearly physical is a good way to check with your health care provider about your health and preventive screening. It is a chance to share any concerns and updates on your health and to receive a thorough exam.   Visit your dentist for a routine exam and preventive care every 6 months. Brush your teeth twice a day and floss once a day. Good oral hygiene prevents tooth decay and gum disease.   The frequency of eye exams is based on your age, health, family medical history, use of contact lenses, and other factors. Follow your health care  provider's recommendations for frequency of eye exams.   Eat a healthy diet. Foods like vegetables, fruits, whole grains, low-fat dairy products, and lean protein foods contain the nutrients you need without too many calories. Decrease your intake of foods high in solid fats, added sugars, and salt. Eat the right amount of calories for you.Get information about a proper diet from your health care provider, if necessary.   Regular physical exercise is one of the most important things you can do for your health. Most adults should get at least 150 minutes of moderate-intensity exercise (any activity that increases your heart rate and causes you to sweat) each week. In addition, most adults need  muscle-strengthening exercises on 2 or more days a week.   Maintain a healthy weight. The body mass index (BMI) is a screening tool to identify possible weight problems. It provides an estimate of body fat based on height and weight. Your health care provider can find your BMI, and can help you achieve or maintain a healthy weight.For adults 20 years and older:   - A BMI below 18.5 is considered underweight.   - A BMI of 18.5 to 24.9 is normal.   - A BMI of 25 to 29.9 is considered overweight.   - A BMI of 30 and above is considered obese.   Maintain normal blood lipids and cholesterol levels by exercising and minimizing your intake of trans and saturated fats.  Eat a balanced diet with plenty of fruit and vegetables. Blood tests for lipids and cholesterol should begin at age 34 and be repeated every 5 years minimum.  If your lipid or cholesterol levels are high, you are over 40, or you are at high risk for heart disease, you may need your cholesterol levels checked more frequently.Ongoing high lipid and cholesterol levels should be treated with medicines if diet and exercise are not working.   If you smoke, find out from your health care provider how to quit. If you do not use tobacco, do not start.   Lung cancer screening is recommended for adults aged 49-80 years who are at high risk for developing lung cancer because of a history of smoking. A yearly low-dose CT scan of the lungs is recommended for people who have at least a 30-pack-year history of smoking and are a current smoker or have quit within the past 15 years. A pack year of smoking is smoking an average of 1 pack of cigarettes a day for 1 year (for example: 1 pack a day for 30 years or 2 packs a day for 15 years). Yearly screening should continue until the smoker has stopped smoking for at least 15 years. Yearly screening should be stopped for people who develop a health problem that would prevent them from having lung cancer  treatment.   If you are pregnant, do not drink alcohol. If you are breastfeeding, be very cautious about drinking alcohol. If you are not pregnant and choose to drink alcohol, do not have more than 1 drink per day. One drink is considered to be 12 ounces (355 mL) of beer, 5 ounces (148 mL) of wine, or 1.5 ounces (44 mL) of liquor.   Avoid use of street drugs. Do not share needles with anyone. Ask for help if you need support or instructions about stopping the use of drugs.   High blood pressure causes heart disease and increases the risk of stroke. Your blood pressure should be checked at least yearly.  Ongoing high blood  pressure should be treated with medicines if weight loss and exercise do not work.   If you are 1-36 years old, ask your health care provider if you should take aspirin to prevent strokes.   Diabetes screening involves taking a blood sample to check your fasting blood sugar level. This should be done once every 3 years, after age 47, if you are within normal weight and without risk factors for diabetes. Testing should be considered at a younger age or be carried out more frequently if you are overweight and have at least 1 risk factor for diabetes.   Breast cancer screening is essential preventive care for women. You should practice "breast self-awareness."  This means understanding the normal appearance and feel of your breasts and may include breast self-examination.  Any changes detected, no matter how small, should be reported to a health care provider.  Women in their 77s and 30s should have a clinical breast exam (CBE) by a health care provider as part of a regular health exam every 1 to 3 years.  After age 71, women should have a CBE every year.  Starting at age 53, women should consider having a mammogram (breast X-ray test) every year.  Women who have a family history of breast cancer should talk to their health care provider about genetic screening.  Women at a high  risk of breast cancer should talk to their health care providers about having an MRI and a mammogram every year.   -Breast cancer gene (BRCA)-related cancer risk assessment is recommended for women who have family members with BRCA-related cancers. BRCA-related cancers include breast, ovarian, tubal, and peritoneal cancers. Having family members with these cancers may be associated with an increased risk for harmful changes (mutations) in the breast cancer genes BRCA1 and BRCA2. Results of the assessment will determine the need for genetic counseling and BRCA1 and BRCA2 testing.   The Pap test is a screening test for cervical cancer. A Pap test can show cell changes on the cervix that might become cervical cancer if left untreated. A Pap test is a procedure in which cells are obtained and examined from the lower end of the uterus (cervix).   - Women should have a Pap test starting at age 47.   - Between ages 82 and 9, Pap tests should be repeated every 2 years.   - Beginning at age 74, you should have a Pap test every 3 years as long as the past 3 Pap tests have been normal.   - Some women have medical problems that increase the chance of getting cervical cancer. Talk to your health care provider about these problems. It is especially important to talk to your health care provider if a new problem develops soon after your last Pap test. In these cases, your health care provider may recommend more frequent screening and Pap tests.   - The above recommendations are the same for women who have or have not gotten the vaccine for human papillomavirus (HPV).   - If you had a hysterectomy for a problem that was not cancer or a condition that could lead to cancer, then you no longer need Pap tests. Even if you no longer need a Pap test, a regular exam is a good idea to make sure no other problems are starting.   - If you are between ages 10 and 6 years, and you have had normal Pap tests going back 10  years, you no longer need Pap tests.  Even if you no longer need a Pap test, a regular exam is a good idea to make sure no other problems are starting.   - If you have had past treatment for cervical cancer or a condition that could lead to cancer, you need Pap tests and screening for cancer for at least 20 years after your treatment.   - If Pap tests have been discontinued, risk factors (such as a new sexual partner) need to be reassessed to determine if screening should be resumed.   - The HPV test is an additional test that may be used for cervical cancer screening. The HPV test looks for the virus that can cause the cell changes on the cervix. The cells collected during the Pap test can be tested for HPV. The HPV test could be used to screen women aged 70 years and older, and should be used in women of any age who have unclear Pap test results. After the age of 27, women should have HPV testing at the same frequency as a Pap test.   Colorectal cancer can be detected and often prevented. Most routine colorectal cancer screening begins at the age of 73 years and continues through age 5 years. However, your health care provider may recommend screening at an earlier age if you have risk factors for colon cancer. On a yearly basis, your health care provider may provide home test kits to check for hidden blood in the stool.  Use of a small camera at the end of a tube, to directly examine the colon (sigmoidoscopy or colonoscopy), can detect the earliest forms of colorectal cancer. Talk to your health care provider about this at age 17, when routine screening begins. Direct exam of the colon should be repeated every 5 -10 years through age 5 years, unless early forms of pre-cancerous polyps or small growths are found.   People who are at an increased risk for hepatitis B should be screened for this virus. You are considered at high risk for hepatitis B if:  -You were born in a country where hepatitis B  occurs often. Talk with your health care provider about which countries are considered high risk.  - Your parents were born in a high-risk country and you have not received a shot to protect against hepatitis B (hepatitis B vaccine).  - You have HIV or AIDS.  - You use needles to inject street drugs.  - You live with, or have sex with, someone who has Hepatitis B.  - You get hemodialysis treatment.  - You take certain medicines for conditions like cancer, organ transplantation, and autoimmune conditions.   Hepatitis C blood testing is recommended for all people born from [birth year] through 1963 and any individual with known risks for hepatitis C.   Practice safe sex. Use condoms and avoid high-risk sexual practices to reduce the spread of sexually transmitted infections (STIs). STIs include gonorrhea, chlamydia, syphilis, trichomonas, herpes, HPV, and human immunodeficiency virus (HIV). Herpes, HIV, and HPV are viral illnesses that have no cure. They can result in disability, cancer, and death. Sexually active women aged 80 years and younger should be checked for chlamydia. Older women with new or multiple partners should also be tested for chlamydia. Testing for other STIs is recommended if you are sexually active and at increased risk.   Osteoporosis is a disease in which the bones lose minerals and strength with aging. This can result in serious bone fractures or breaks. The risk of osteoporosis can be  identified using a bone density scan. Women ages 43 years and over and women at risk for fractures or osteoporosis should discuss screening with their health care providers. Ask your health care provider whether you should take a calcium supplement or vitamin D to There are also several preventive steps women can take to avoid osteoporosis and resulting fractures or to keep osteoporosis from worsening. -->Recommendations include:  Eat a balanced diet high in fruits, vegetables, calcium, and  vitamins.  Get enough calcium. The recommended total intake of is 1,200 mg daily; for best absorption, if taking supplements, divide doses into 250-500 mg doses throughout the day. Of the two types of calcium, calcium carbonate is best absorbed when taken with food but calcium citrate can be taken on an empty stomach.  Get enough vitamin D. NAMS and the [employer] recommend at least 1,000 IU per day for women age 27 and over who are at risk of vitamin D deficiency. Vitamin D deficiency can be caused by inadequate sun exposure (for example, those who live in [place]).  Avoid alcohol and smoking. Heavy alcohol intake (more than 7 drinks per week) increases the risk of falls and hip fracture and women smokers tend to lose bone more rapidly and have lower bone mass than nonsmokers. Stopping smoking is one of the most important changes women can make to improve their health and decrease risk for disease.  Be physically active every day. Weight-bearing exercise (for example, fast walking, hiking, jogging, and weight training) may strengthen bones or slow the rate of bone loss that comes with aging. Balancing and muscle-strengthening exercises can reduce the risk of falling and fracture.  Consider therapeutic medications. Currently, several types of effective drugs are available. Healthcare providers can recommend the type most appropriate for each woman.  Eliminate environmental factors that may contribute to accidents. Falls cause nearly 90% of all osteoporotic fractures, so reducing this risk is an important bone-health strategy. Measures include ample lighting, removing obstructions to walking, using nonskid rugs on floors, and placing mats and/or grab bars in showers.  Be aware of medication side effects. Some common medicines make bones weaker. These include a type of steroid drug called glucocorticoids used for arthritis and asthma, some antiseizure drugs, certain  sleeping pills, treatments for endometriosis, and some cancer drugs. An overactive thyroid gland or using too much thyroid hormone for an underactive thyroid can also be a problem. If you are taking these medicines, talk to your doctor about what you can do to help protect your bones.reduce the rate of osteoporosis.    Menopause can be associated with physical symptoms and risks. Hormone replacement therapy is available to decrease symptoms and risks. You should talk to your health care provider about whether hormone replacement therapy is right for you.   Use sunscreen. Apply sunscreen liberally and repeatedly throughout the day. You should seek shade when your shadow is shorter than you. Protect yourself by wearing long sleeves, pants, a wide-brimmed hat, and sunglasses year round, whenever you are outdoors.   Once a month, do a whole body skin exam, using a mirror to look at the skin on your back. Tell your health care provider of new moles, moles that have irregular borders, moles that are larger than a pencil eraser, or moles that have changed in shape or color.   -Stay current with required vaccines (immunizations).   Influenza vaccine. All adults should be immunized every year.  Tetanus, diphtheria, and acellular pertussis (Td, Tdap) vaccine. Pregnant  women should receive 1 dose of Tdap vaccine during each pregnancy. The dose should be obtained regardless of the length of time since the last dose. Immunization is preferred during the 27th 36th week of gestation. An adult who has not previously received Tdap or who does not know her vaccine status should receive 1 dose of Tdap. This initial dose should be followed by tetanus and diphtheria toxoids (Td) booster doses every 10 years. Adults with an unknown or incomplete history of completing a 3-dose immunization series with Td-containing vaccines should begin or complete a primary immunization series including a Tdap dose. Adults should  receive a Td booster every 10 years.  Varicella vaccine. An adult without evidence of immunity to varicella should receive 2 doses or a second dose if she has previously received 1 dose. Pregnant females who do not have evidence of immunity should receive the first dose after pregnancy. This first dose should be obtained before leaving the health care facility. The second dose should be obtained 4 8 weeks after the first dose.  Human papillomavirus (HPV) vaccine. Females aged 76 26 years who have not received the vaccine previously should obtain the 3-dose series. The vaccine is not recommended for use in pregnant females. However, pregnancy testing is not needed before receiving a dose. If a female is found to be pregnant after receiving a dose, no treatment is needed. In that case, the remaining doses should be delayed until after the pregnancy. Immunization is recommended for any person with an immunocompromised condition through the age of 34 years if she did not get any or all doses earlier. During the 3-dose series, the second dose should be obtained 4 8 weeks after the first dose. The third dose should be obtained 24 weeks after the first dose and 16 weeks after the second dose.  Zoster vaccine. One dose is recommended for adults aged 69 years or older unless certain conditions are present.  Measles, mumps, and rubella (MMR) vaccine. Adults born before [birth year] generally are considered immune to measles and mumps. Adults born in [birth year] or later should have 1 or more doses of MMR vaccine unless there is a contraindication to the vaccine or there is laboratory evidence of immunity to each of the three diseases. A routine second dose of MMR vaccine should be obtained at least 28 days after the first dose for students attending postsecondary schools, health care workers, or international travelers. People who received inactivated measles vaccine or an unknown type of measles vaccine during 1963 1967 should  receive 2 doses of MMR vaccine. People who received inactivated mumps vaccine or an unknown type of mumps vaccine before 1979 and are at high risk for mumps infection should consider immunization with 2 doses of MMR vaccine. For females of childbearing age, rubella immunity should be determined. If there is no evidence of immunity, females who are not pregnant should be vaccinated. If there is no evidence of immunity, females who are pregnant should delay immunization until after pregnancy. Unvaccinated health care workers born before [birth year] who lack laboratory evidence of measles, mumps, or rubella immunity or laboratory confirmation of disease should consider measles and mumps immunization with 2 doses of MMR vaccine or rubella immunization with 1 dose of MMR vaccine.  Pneumococcal 13-valent conjugate (PCV13) vaccine. When indicated, a person who is uncertain of her immunization history and has no record of immunization should receive the PCV13 vaccine. An adult aged 39 years or older who has certain medical conditions and has not  been previously immunized should receive 1 dose of PCV13 vaccine. This PCV13 should be followed with a dose of pneumococcal polysaccharide (PPSV23) vaccine. The PPSV23 vaccine dose should be obtained at least 8 weeks after the dose of PCV13 vaccine. An adult aged 26 years or older who has certain medical conditions and previously received 1 or more doses of PPSV23 vaccine should receive 1 dose of PCV13. The PCV13 vaccine dose should be obtained 1 or more years after the last PPSV23 vaccine dose.  Pneumococcal polysaccharide (PPSV23) vaccine. When PCV13 is also indicated, PCV13 should be obtained first. All adults aged 69 years and older should be immunized. An adult younger than age 50 years who has certain medical conditions should be immunized. Any person who resides in a nursing home or long-term care facility should be immunized. An adult smoker should be immunized. People with an  immunocompromised condition and certain other conditions should receive both PCV13 and PPSV23 vaccines. People with human immunodeficiency virus (HIV) infection should be immunized as soon as possible after diagnosis. Immunization during chemotherapy or radiation therapy should be avoided. Routine use of PPSV23 vaccine is not recommended for American Indians, [place] Natives, or people younger than 65 years unless there are medical conditions that require PPSV23 vaccine. When indicated, people who have unknown immunization and have no record of immunization should receive PPSV23 vaccine. One-time revaccination 5 years after the first dose of PPSV23 is recommended for people aged 46 64 years who have chronic kidney failure, nephrotic syndrome, asplenia, or immunocompromised conditions. People who received 1 2 doses of PPSV23 before age 59 years should receive another dose of PPSV23 vaccine at age 83 years or later if at least 5 years have passed since the previous dose. Doses of PPSV23 are not needed for people immunized with PPSV23 at or after age 18 years.  Meningococcal vaccine. Adults with asplenia or persistent complement component deficiencies should receive 2 doses of quadrivalent meningococcal conjugate (MenACWY-D) vaccine. The doses should be obtained at least 2 months apart. Microbiologists working with certain meningococcal bacteria, [employer] recruits, people at risk during an outbreak, and people who travel to or live in countries with a high rate of meningitis should be immunized. A first-year college student up through age 31 years who is living in a residence hall should receive a dose if she did not receive a dose on or after her 16th birthday. Adults who have certain high-risk conditions should receive one or more doses of vaccine.  Hepatitis A vaccine. Adults who wish to be protected from this disease, have certain high-risk conditions, work with hepatitis A-infected animals, work in hepatitis A  research labs, or travel to or work in countries with a high rate of hepatitis A should be immunized. Adults who were previously unvaccinated and who anticipate close contact with an international adoptee during the first 60 days after arrival in the [place] from a country with a high rate of hepatitis A should be immunized.  Hepatitis B vaccine.  Adults who wish to be protected from this disease, have certain high-risk conditions, may be exposed to blood or other infectious body fluids, are household contacts or sex partners of hepatitis B positive people, are clients or workers in certain care facilities, or travel to or work in countries with a high rate of hepatitis B should be immunized.  Haemophilus influenzae type b (Hib) vaccine. A previously unvaccinated person with asplenia or sickle cell disease or having a scheduled splenectomy should receive 1 dose of  Hib vaccine. Regardless of previous immunization, a recipient of a hematopoietic stem cell transplant should receive a 3-dose series 6 12 months after her successful transplant. Hib vaccine is not recommended for adults with HIV infection.  Preventive Services / Frequency Ages 56 to 39years  Blood pressure check.** / Every 1 to 2 years.  Lipid and cholesterol check.** / Every 5 years beginning at age 38.  Clinical breast exam.** / Every 3 years for women in their 51s and 35s.  BRCA-related cancer risk assessment.** / For women who have family members with a BRCA-related cancer (breast, ovarian, tubal, or peritoneal cancers).  Pap test.** / Every 2 years from ages 25 through 49. Every 3 years starting at age 33 through age 56 or 21 with a history of 3 consecutive normal Pap tests.  HPV screening.** / Every 3 years from ages 58 through ages 63 to 25 with a history of 3 consecutive normal Pap tests.  Hepatitis C blood test.** / For any individual with known risks for hepatitis C.  Skin self-exam. / Monthly.  Influenza vaccine. /  Every year.  Tetanus, diphtheria, and acellular pertussis (Tdap, Td) vaccine.** / Consult your health care provider. Pregnant women should receive 1 dose of Tdap vaccine during each pregnancy. 1 dose of Td every 10 years.  Varicella vaccine.** / Consult your health care provider. Pregnant females who do not have evidence of immunity should receive the first dose after pregnancy.  HPV vaccine. / 3 doses over 6 months, if 17 and younger. The vaccine is not recommended for use in pregnant females. However, pregnancy testing is not needed before receiving a dose.  Measles, mumps, rubella (MMR) vaccine.** / You need at least 1 dose of MMR if you were born in 1957 or later. You may also need a 2nd dose. For females of childbearing age, rubella immunity should be determined. If there is no evidence of immunity, females who are not pregnant should be vaccinated. If there is no evidence of immunity, females who are pregnant should delay immunization until after pregnancy.  Pneumococcal 13-valent conjugate (PCV13) vaccine.** / Consult your health care provider.  Pneumococcal polysaccharide (PPSV23) vaccine.** / 1 to 2 doses if you smoke cigarettes or if you have certain conditions.  Meningococcal vaccine.** / 1 dose if you are age 49 to 82 years and a Market researcher living in a residence hall, or have one of several medical conditions, you need to get vaccinated against meningococcal disease. You may also need additional booster doses.  Hepatitis A vaccine.** / Consult your health care provider.  Hepatitis B vaccine.** / Consult your health care provider.  Haemophilus influenzae type b (Hib) vaccine.** / Consult your health care provider.  Ages 45 to 64years  Blood pressure check.** / Every 1 to 2 years.  Lipid and cholesterol check.** / Every 5 years beginning at age 29 years.  Lung cancer screening. / Every year if you are aged 78 80 years and have a 30-pack-year history of smoking  and currently smoke or have quit within the past 15 years. Yearly screening is stopped once you have quit smoking for at least 15 years or develop a health problem that would prevent you from having lung cancer treatment.  Clinical breast exam.** / Every year after age 64 years.  BRCA-related cancer risk assessment.** / For women who have family members with a BRCA-related cancer (breast, ovarian, tubal, or peritoneal cancers).  Mammogram.** / Every year beginning at age 85 years and continuing for  as long as you are in good health. Consult with your health care provider.  Pap test.** / Every 3 years starting at age 17 years through age 52 or 44 years with a history of 3 consecutive normal Pap tests.  HPV screening.** / Every 3 years from ages 55 years through ages 20 to 49 years with a history of 3 consecutive normal Pap tests.  Fecal occult blood test (FOBT) of stool. / Every year beginning at age 39 years and continuing until age 12 years. You may not need to do this test if you get a colonoscopy every 10 years.  Flexible sigmoidoscopy or colonoscopy.** / Every 5 years for a flexible sigmoidoscopy or every 10 years for a colonoscopy beginning at age 67 years and continuing until age 58 years.  Hepatitis C blood test.** / For all people born from [birth year] through 1963 and any individual with known risks for hepatitis C.  Skin self-exam. / Monthly.  Influenza vaccine. / Every year.  Tetanus, diphtheria, and acellular pertussis (Tdap/Td) vaccine.** / Consult your health care provider. Pregnant women should receive 1 dose of Tdap vaccine during each pregnancy. 1 dose of Td every 10 years.  Varicella vaccine.** / Consult your health care provider. Pregnant females who do not have evidence of immunity should receive the first dose after pregnancy.  Zoster vaccine.** / 1 dose for adults aged 50 years or older.  Measles, mumps, rubella (MMR) vaccine.** / You need at least 1 dose of MMR if you  were born in 1957 or later. You may also need a 2nd dose. For females of childbearing age, rubella immunity should be determined. If there is no evidence of immunity, females who are not pregnant should be vaccinated. If there is no evidence of immunity, females who are pregnant should delay immunization until after pregnancy.  Pneumococcal 13-valent conjugate (PCV13) vaccine.** / Consult your health care provider.  Pneumococcal polysaccharide (PPSV23) vaccine.** / 1 to 2 doses if you smoke cigarettes or if you have certain conditions.  Meningococcal vaccine.** / Consult your health care provider.  Hepatitis A vaccine.** / Consult your health care provider.  Hepatitis B vaccine.** / Consult your health care provider.  Haemophilus influenzae type b (Hib) vaccine.** / Consult your health care provider.  Ages 51 years and over  Blood pressure check.** / Every 1 to 2 years.  Lipid and cholesterol check.** / Every 5 years beginning at age 4 years.  Lung cancer screening. / Every year if you are aged 58 80 years and have a 30-pack-year history of smoking and currently smoke or have quit within the past 15 years. Yearly screening is stopped once you have quit smoking for at least 15 years or develop a health problem that would prevent you from having lung cancer treatment.  Clinical breast exam.** / Every year after age 71 years.  BRCA-related cancer risk assessment.** / For women who have family members with a BRCA-related cancer (breast, ovarian, tubal, or peritoneal cancers).  Mammogram.** / Every year beginning at age 60 years and continuing for as long as you are in good health. Consult with your health care provider.  Pap test.** / Every 3 years starting at age 52 years through age 71 or 70 years with 3 consecutive normal Pap tests. Testing can be stopped between 65 and 70 years with 3 consecutive normal Pap tests and no abnormal Pap or HPV tests in the past 10 years.  HPV screening.**  / Every 3 years from ages 26  years through ages 28 or 11 years with a history of 3 consecutive normal Pap tests. Testing can be stopped between 65 and 70 years with 3 consecutive normal Pap tests and no abnormal Pap or HPV tests in the past 10 years.  Fecal occult blood test (FOBT) of stool. / Every year beginning at age 58 years and continuing until age 76 years. You may not need to do this test if you get a colonoscopy every 10 years.  Flexible sigmoidoscopy or colonoscopy.** / Every 5 years for a flexible sigmoidoscopy or every 10 years for a colonoscopy beginning at age 47 years and continuing until age 13 years.  Hepatitis C blood test.** / For all people born from [birth year] through 1963 and any individual with known risks for hepatitis C.  Osteoporosis screening.** / A one-time screening for women ages 30 years and over and women at risk for fractures or osteoporosis.  Skin self-exam. / Monthly.  Influenza vaccine. / Every year.  Tetanus, diphtheria, and acellular pertussis (Tdap/Td) vaccine.** / 1 dose of Td every 10 years.  Varicella vaccine.** / Consult your health care provider.  Zoster vaccine.** / 1 dose for adults aged 21 years or older.  Pneumococcal 13-valent conjugate (PCV13) vaccine.** / Consult your health care provider.  Pneumococcal polysaccharide (PPSV23) vaccine.** / 1 dose for all adults aged 17 years and older.  Meningococcal vaccine.** / Consult your health care provider.  Hepatitis A vaccine.** / Consult your health care provider.  Hepatitis B vaccine.** / Consult your health care provider.  Haemophilus influenzae type b (Hib) vaccine.** / Consult your health care provider. ** Family history and personal history of risk and conditions may change your health care provider's recommendations. Document Released: 10/20/2001 Document Revised: 06/14/2013  [facility] Patient Information 2014 [place], [place].   EXERCISE AND DIET:  We recommended that you start or  continue a regular exercise program for good health. Regular exercise means any activity that makes your heart beat faster and makes you sweat.  We recommend exercising at least 30 minutes per day at least 3 days a week, preferably 5.  We also recommend a diet low in fat and sugar / carbohydrates.  Inactivity, poor dietary choices and obesity can cause diabetes, heart attack, stroke, and kidney damage, among others.     ALCOHOL AND SMOKING:  Women should limit their alcohol intake to no more than 7 drinks/beers/glasses of wine (combined, not each!) per week. Moderation of alcohol intake to this level decreases your risk of breast cancer and liver damage.  ( And of course, no recreational drugs are part of a healthy lifestyle.)  Also, you should not be smoking at all or even being exposed to second hand smoke. Most people know smoking can cause cancer, and various heart and lung diseases, but did you know it also contributes to weakening of your bones?  Aging of your skin?  Yellowing of your teeth and nails?   CALCIUM AND VITAMIN D:  Adequate intake of calcium and Vitamin D are recommended.  The recommendations for exact amounts of these supplements seem to change often, but generally speaking 600 mg of calcium (either carbonate or citrate) and 800 units of Vitamin D per day seems prudent. Certain women may benefit from higher intake of Vitamin D.  If you are among these women, your doctor will have told you during your visit.     PAP SMEARS:  Pap smears, to check for cervical cancer or precancers,  have traditionally been done yearly,  although recent scientific advances have shown that most women can have pap smears less often.  However, every woman still should have a physical exam from her gynecologist or primary care physician every year. It will include a breast check, inspection of the vulva and vagina to check for abnormal growths or skin changes, a visual exam of the cervix, and then an exam to  evaluate the size and shape of the uterus and ovaries.  And after 51 years of age, a rectal exam is indicated to check for rectal cancers. We will also provide age appropriate advice regarding health maintenance, like when you should have certain vaccines, screening for sexually transmitted diseases, bone density testing, colonoscopy, mammograms, etc.    MAMMOGRAMS:  All women over 67 years old should have a yearly mammogram. Many facilities now offer a "3D" mammogram, which may cost around $50 extra out of pocket. If possible,  we recommend you accept the option to have the 3D mammogram performed.  It both reduces the number of women who will be called back for extra views which then turn out to be normal, and it is better than the routine mammogram at detecting truly abnormal areas.     COLONOSCOPY:  Colonoscopy to screen for colon cancer is recommended for all women at age 5.  We know, you hate the idea of the prep.  We agree, BUT, having colon cancer and not knowing it is worse!!  Colon cancer so often starts as a polyp that can be seen and removed at colonscopy, which can quite literally save your life!  And if your first colonoscopy is normal and you have no family history of colon cancer, most women don't have to have it again for 10 years.  Once every ten years, you can do something that may end up saving your life, right?  We will be happy to help you get it scheduled when you are ready.  Be sure to check your insurance coverage so you understand how much it will cost.  It may be covered as a preventative service at no cost, but you should check your particular policy.

## 2018-10-01 LAB — COMPREHENSIVE METABOLIC PANEL
ALT: 13 IU/L (ref 0–32)
AST: 14 IU/L (ref 0–40)
Albumin/Globulin Ratio: 1.9 (ref 1.2–2.2)
Albumin: 4.5 g/dL (ref 3.8–4.8)
Alkaline Phosphatase: 70 IU/L (ref 39–117)
BUN/Creatinine Ratio: 20 (ref 9–23)
BUN: 17 mg/dL (ref 6–24)
Bilirubin Total: 0.3 mg/dL (ref 0.0–1.2)
CO2: 20 mmol/L (ref 20–29)
Calcium: 9.2 mg/dL (ref 8.7–10.2)
Chloride: 102 mmol/L (ref 96–106)
Creatinine, Ser: 0.83 mg/dL (ref 0.57–1.00)
GFR calc Af Amer: 95 mL/min/{1.73_m2} (ref >59)
GFR calc non Af Amer: 82 mL/min/{1.73_m2} (ref >59)
Globulin, Total: 2.4 g/dL (ref 1.5–4.5)
Glucose: 98 mg/dL (ref 65–99)
POTASSIUM: 4.7 mmol/L (ref 3.5–5.2)
Sodium: 139 mmol/L (ref 134–144)
Total Protein: 6.9 g/dL (ref 6.0–8.5)

## 2018-10-01 LAB — T4, FREE: Free T4: 1.06 ng/dL (ref 0.82–1.77)

## 2018-10-01 LAB — CBC WITH DIFFERENTIAL/PLATELET
Basophils Absolute: 0 10*3/uL (ref 0.0–0.2)
Basos: 1 %
EOS (ABSOLUTE): 0.1 10*3/uL (ref 0.0–0.4)
Eos: 3 %
Hematocrit: 36.7 % (ref 34.0–46.6)
Hemoglobin: 12.8 g/dL (ref 11.1–15.9)
IMMATURE GRANULOCYTES: 0 %
Immature Grans (Abs): 0 10*3/uL (ref 0.0–0.1)
LYMPHS: 28 %
Lymphocytes Absolute: 1.2 10*3/uL (ref 0.7–3.1)
MCH: 31.8 pg (ref 26.6–33.0)
MCHC: 34.9 g/dL (ref 31.5–35.7)
MCV: 91 fL (ref 79–97)
Monocytes Absolute: 0.3 10*3/uL (ref 0.1–0.9)
Monocytes: 8 %
Neutrophils Absolute: 2.6 10*3/uL (ref 1.4–7.0)
Neutrophils: 60 %
Platelets: 209 10*3/uL (ref 150–450)
RBC: 4.03 x10E6/uL (ref 3.77–5.28)
RDW: 11.8 % (ref 11.7–15.4)
WBC: 4.3 10*3/uL (ref 3.4–10.8)

## 2018-10-01 LAB — LIPID PANEL
CHOLESTEROL TOTAL: 197 mg/dL (ref 100–199)
Chol/HDL Ratio: 3.5 ratio (ref 0.0–4.4)
HDL: 57 mg/dL (ref >39)
LDL Calculated: 127 mg/dL — ABNORMAL HIGH (ref 0–99)
Triglycerides: 64 mg/dL (ref 0–149)
VLDL Cholesterol Cal: 13 mg/dL (ref 5–40)

## 2018-10-01 LAB — HEMOGLOBIN A1C
Est. average glucose Bld gHb Est-mCnc: 117 mg/dL
Hgb A1c MFr Bld: 5.7 % — ABNORMAL HIGH (ref 4.8–5.6)

## 2018-10-01 LAB — VITAMIN D 25 HYDROXY (VIT D DEFICIENCY, FRACTURES): Vit D, 25-Hydroxy: 26.1 ng/mL — ABNORMAL LOW (ref 30.0–100.0)

## 2018-10-01 LAB — TSH: TSH: 1.74 u[IU]/mL (ref 0.450–4.500)

## 2018-10-01 LAB — VITAMIN B12: VITAMIN B 12: 1223 pg/mL (ref 232–1245)

## 2018-10-04 DIAGNOSIS — R87618 Other abnormal cytological findings on specimens from cervix uteri: Secondary | ICD-10-CM | POA: Diagnosis not present

## 2018-10-04 DIAGNOSIS — Z01411 Encounter for gynecological examination (general) (routine) with abnormal findings: Secondary | ICD-10-CM | POA: Diagnosis not present

## 2018-10-04 DIAGNOSIS — Z124 Encounter for screening for malignant neoplasm of cervix: Secondary | ICD-10-CM | POA: Diagnosis not present

## 2018-10-04 DIAGNOSIS — Z1231 Encounter for screening mammogram for malignant neoplasm of breast: Secondary | ICD-10-CM | POA: Diagnosis not present

## 2018-10-04 LAB — HM MAMMOGRAPHY

## 2018-10-04 LAB — HM PAP SMEAR: HM Pap smear: NEGATIVE

## 2018-10-05 ENCOUNTER — Telehealth: Payer: Self-pay | Admitting: Family Medicine

## 2018-10-05 NOTE — Telephone Encounter (Signed)
Called with lab results please see result note. MPulliam, CMA/RT(R)

## 2018-10-05 NOTE — Telephone Encounter (Signed)
Patient called back after getting a call from [facility], please contact patient back when available, and call her on her mobile phone please

## 2018-10-07 ENCOUNTER — Encounter: Payer: Self-pay | Admitting: Family Medicine

## 2018-10-12 ENCOUNTER — Encounter: Payer: Self-pay | Admitting: Family Medicine

## 2018-10-25 DIAGNOSIS — R928 Other abnormal and inconclusive findings on diagnostic imaging of breast: Secondary | ICD-10-CM | POA: Diagnosis not present

## 2019-01-25 DIAGNOSIS — D2261 Melanocytic nevi of right upper limb, including shoulder: Secondary | ICD-10-CM | POA: Diagnosis not present

## 2019-01-25 DIAGNOSIS — D225 Melanocytic nevi of trunk: Secondary | ICD-10-CM | POA: Diagnosis not present

## 2019-01-25 DIAGNOSIS — L821 Other seborrheic keratosis: Secondary | ICD-10-CM | POA: Diagnosis not present

## 2019-01-25 DIAGNOSIS — C44619 Basal cell carcinoma of skin of left upper limb, including shoulder: Secondary | ICD-10-CM | POA: Diagnosis not present

## 2019-09-13 ENCOUNTER — Telehealth: Payer: Self-pay | Admitting: Family Medicine

## 2019-09-13 NOTE — Telephone Encounter (Signed)
Patient states Husband test 1/2 came back positive for COVID --- Pt & daughter(MadelineMartin / DOB 6.19.00) both (Dr.o's) have quarantine questions & request nurse/ provider call them back with guidance.@ [phone].  --glh

## 2019-09-13 NOTE — Telephone Encounter (Signed)
..  Per CDC recommendations 10 days after symptom onset and 24 hrs fever free with no fever reducing medications and other COVID symptoms are improving   Advised patient to quarantine as directed above per CDC. Advised them to use separate bathrooms and to wear masks when coming in contact in the home.   Patient is aware of the above and verbalized understanding. AS, CMA

## 2019-09-17 ENCOUNTER — Encounter: Payer: Self-pay | Admitting: Family Medicine

## 2020-05-14 ENCOUNTER — Other Ambulatory Visit: Payer: Self-pay | Admitting: Physician Assistant

## 2020-05-14 DIAGNOSIS — Z0000 Encounter for general adult medical examination without abnormal findings: Secondary | ICD-10-CM

## 2020-05-15 ENCOUNTER — Other Ambulatory Visit: Payer: 59

## 2020-05-15 ENCOUNTER — Other Ambulatory Visit: Payer: Self-pay

## 2020-05-15 DIAGNOSIS — Z0000 Encounter for general adult medical examination without abnormal findings: Secondary | ICD-10-CM

## 2020-05-16 LAB — SARS-COV-2 ANTIBODY, IGM: SARS-CoV-2 Antibody, IgM: POSITIVE

## 2020-08-05 ENCOUNTER — Ambulatory Visit: Payer: 59 | Attending: Internal Medicine

## 2020-08-05 DIAGNOSIS — Z23 Encounter for immunization: Secondary | ICD-10-CM

## 2020-08-05 NOTE — Progress Notes (Signed)
   Covid-19 Vaccination Clinic  Name:  Julie Juarez    MRN: [id number] DOB: [birth year]  08/05/2020  Julie Juarez was observed post Covid-19 immunization for 15 minutes without incident. She was provided with Vaccine Information Sheet and instruction to access the V-Safe system.   Julie Juarez was instructed to call 911 with any severe reactions post vaccine: Marland Kitchen Difficulty breathing  . Swelling of face and throat  . A fast heartbeat  . A bad rash all over body  . Dizziness and weakness   Immunizations Administered    Name Date Dose VIS Date Route   Pfizer COVID-19 Vaccine 08/05/2020  4:12 PM 0.3 mL 06/26/2020 Intramuscular   Manufacturer: [place]   Lot: [id number]   [place]: [id number]

## 2020-08-26 ENCOUNTER — Ambulatory Visit: Payer: 59

## 2020-08-26 ENCOUNTER — Ambulatory Visit: Payer: 59 | Attending: Internal Medicine

## 2020-08-26 DIAGNOSIS — Z23 Encounter for immunization: Secondary | ICD-10-CM

## 2020-08-26 NOTE — Progress Notes (Signed)
   Covid-19 Vaccination Clinic  Name:  Keiara Sneeringer    MRN: [id number] DOB: 1965-10-10  08/26/2020  Ms. Scripter was observed post Covid-19 immunization for 15 minutes without incident. She was provided with Vaccine Information Sheet and instruction to access the V-Safe system.   Ms. Seabolt was instructed to call 911 with any severe reactions post vaccine: Marland Kitchen Difficulty breathing  . Swelling of face and throat  . A fast heartbeat  . A bad rash all over body  . Dizziness and weakness   Immunizations Administered    Name Date Dose VIS Date [place] COVID-19 Vaccine 08/26/2020  2:05 PM 0.3 mL 06/26/2020 Intramuscular   Manufacturer: [place]   Lot: [id number]   [place]: [id number]

## 2020-09-02 ENCOUNTER — Ambulatory Visit: Payer: 59

## 2021-02-10 ENCOUNTER — Encounter: Payer: Self-pay | Admitting: Nurse Practitioner

## 2021-02-10 ENCOUNTER — Ambulatory Visit (INDEPENDENT_AMBULATORY_CARE_PROVIDER_SITE_OTHER): Payer: No Typology Code available for payment source | Admitting: Nurse Practitioner

## 2021-02-10 ENCOUNTER — Other Ambulatory Visit: Payer: Self-pay

## 2021-02-10 VITALS — BP 109/70 | HR 56 | Temp 97.3°F | Ht 62.0 in | Wt 150.0 lb

## 2021-02-10 DIAGNOSIS — Z7689 Persons encountering health services in other specified circumstances: Secondary | ICD-10-CM

## 2021-02-10 DIAGNOSIS — Z0000 Encounter for general adult medical examination without abnormal findings: Secondary | ICD-10-CM

## 2021-02-10 DIAGNOSIS — R5383 Other fatigue: Secondary | ICD-10-CM

## 2021-02-10 DIAGNOSIS — E559 Vitamin D deficiency, unspecified: Secondary | ICD-10-CM

## 2021-02-10 DIAGNOSIS — R7301 Impaired fasting glucose: Secondary | ICD-10-CM

## 2021-02-10 DIAGNOSIS — B001 Herpesviral vesicular dermatitis: Secondary | ICD-10-CM | POA: Diagnosis not present

## 2021-02-10 MED ORDER — VALACYCLOVIR HCL 1 G PO TABS: ORAL_TABLET | ORAL | 0 refills | Status: DC

## 2021-02-10 NOTE — Patient Instructions (Signed)
Cold Sore  A cold sore, also called a fever blister, is a small, fluid-filled sore that forms inside of the mouth or on the lips, gums, nose, chin, or cheeks. Cold sores can spread to other parts of the body, such as the eyes or fingers. Cold sores can spread from person to person (are contagious) until the sores crust over completely. Most cold sores go away within 2 weeks. What are the causes? Cold sores are caused by a virus (herpes simplex virus type 1, HSV-1). The virus can spread from person to person through close contact, such as through:  Kissing.  Touching the affected area.  Sharing personal items such as lip balm, razors, a drinking glass, or eating utensils. What increases the risk? You are more likely to develop this condition if you:  Are tired, stressed, or sick.  Are having your period (menstruating).  Are pregnant.  Take certain medicines.  Are out in cold weather or get too much sun. What are the signs or symptoms? Symptoms of a cold sore outbreak go through different stages. These are the stages of a cold sore:  Tingling, itching, or burning is felt 1-2 days before the outbreak.  Fluid-filled blisters appear on the lips, inside the mouth, on the nose, or on the cheeks.  The blisters start to ooze clear fluid.  The blisters dry up, and a yellow crust appears in their place.  The crust falls off. In some cases, other symptoms can develop during a cold sore outbreak. These can include:  Fever.  Sore throat.  Headache.  Muscle aches.  Swollen neck glands. How is this treated? There is no cure for cold sores or the virus that causes them. There is also no vaccine to prevent the virus. Most cold sores go away on their own without treatment within 2 weeks. Your doctor may prescribe medicines to:  Help with pain.  Keep the virus from growing.  Help you heal faster. Medicines may be in the form of creams, gels, pills, or a shot. Follow these  instructions at home: Medicines  Take or apply over-the-counter and prescription medicines only as told by your doctor.  Use a cotton-tip swab to apply creams or gels to your sores.  Ask your doctor if you can take lysine supplements. These may help with healing. Sore care  Do not touch the sores or pick the scabs.  Wash your hands often. Do not touch your eyes without washing your hands first.  Keep the sores clean and dry.  If told, put ice on the sores: ? Put ice in a plastic bag. ? Place a towel between your skin and the bag. ? Leave the ice on for 20 minutes, 2-3 times a day.   Eating and drinking  Eat a soft, bland diet. Avoid eating hot, cold, or salty foods. These can hurt your mouth.  Use a straw if it hurts to drink out of a glass.  Eat foods that have a lot of lysine in them. These include meat, fish, and dairy products.  Avoid sugary foods, chocolates, nuts, and grains. These foods have a high amount of a substance (arginine) that can cause the virus to grow. Lifestyle  Do not kiss, have oral sex, or share personal items until your sores heal.  Stress, poor sleep, and being out in the sun can trigger outbreaks. Make sure you: ? Do activities that help you relax, such as deep breathing exercises or meditation. ? Get enough sleep. ? Apply   sunscreen on your lips before you go out in the sun. Contact a doctor if:  You have symptoms for more than 2 weeks.  You have pus coming from the sores.  You have redness that is spreading.  You have pain or irritation in your eye.  You get sores on your genitals.  Your sores do not heal within 2 weeks.  You get cold sores often. Get help right away if:  You have a fever and your symptoms suddenly get worse.  You have a headache and confusion.  You have tiredness (fatigue).  You do not want to eat as much as normal (loss of appetite).  You have a stiff neck or are sensitive to light. Summary  A cold sore is  a small, fluid-filled sore that forms inside of the mouth or on the lips, gums, nose, chin, or cheeks.  Cold sores can spread from person to person (are contagious) until the sores crust over completely. Most cold sores go away within 2 weeks.  Wash your hands often. Do not touch your eyes without washing your hands first.  Do not kiss, have oral sex, or share personal items until your sores heal.  Contact a doctor if your sores do not heal within 2 weeks. This information is not intended to replace advice given to you by your health care provider. Make sure you discuss any questions you have with your health care provider. Document Revised: 12/14/2018 Document Reviewed: 01/24/2018 Elsevier Patient Education  2021 [place], Adult Feeling a certain amount of stress is normal. Stress helps our body and mind get ready to deal with the demands of life. Stress hormones can motivate you to do well at work and meet your responsibilities. However severe or long-lasting (chronic) stress can affect your mental and physical health. Chronic stress puts you at higher risk for anxiety, depression, and other health problems like digestive problems, muscle aches, heart disease, high blood pressure, and stroke. What are the causes? Common causes of stress include:  Demands from work, such as deadlines, feeling overworked, or having long hours.  Pressures at home, such as money issues, disagreements with a spouse, or parenting issues.  Pressures from major life changes, such as divorce, moving, loss of a loved one, or chronic illness. You may be at higher risk for stress-related problems if you do not get enough sleep, are in poor health, do not have emotional support, or have a mental health disorder like anxiety or depression. How to recognize stress Stress can make you:  Have trouble sleeping.  Feel sad, anxious, irritable, or overwhelmed.  Lose your appetite.  Overeat or  want to eat unhealthy foods.  Want to use drugs or alcohol. Stress can also cause physical symptoms, such as:  Sore, tense muscles, especially in the shoulders and neck.  Headaches.  Trouble breathing.  A faster heart rate.  Stomach pain, nausea, or vomiting.  Diarrhea or constipation.  Trouble concentrating. Follow these instructions at home: Lifestyle  Identify the source of your stress and your reaction to it. See a therapist who can help you change your reactions.  When there are stressful events: ? Talk about it with family, friends, or co-workers. ? Try to think realistically about stressful events and not ignore them or overreact. ? Try to find the positives in a stressful situation and not focus on the negatives. ? Cut back on responsibilities at work and home, if possible. Ask for help from friends or family  members if you need it.  Find ways to cope with stress, such as: ? Meditation. ? Deep breathing. ? Yoga or tai chi. ? Progressive muscle relaxation. ? Doing art, playing music, or reading. ? Making time for fun activities. ? Spending time with family and friends.  Get support from family, friends, or spiritual resources. Eating and drinking  Eat a healthy diet. This includes: ? Eating foods that are high in fiber, such as beans, whole grains, and fresh fruits and vegetables. ? Limiting foods that are high in fat and processed sugars, such as fried and sweet foods.  Do not skip meals or overeat.  Drink enough fluid to keep your urine pale yellow. Alcohol use  Do not drink alcohol if: ? Your health care provider tells you not to drink. ? You are pregnant, may be pregnant, or are planning to become pregnant.  Drinking alcohol is a way some people try to ease their stress. This can be dangerous, so if you drink alcohol: ? Limit how much you use to:  0-1 drink a day for women.  0-2 drinks a day for men. ? Be aware of how much alcohol is in your  drink. In the [place] one drink equals one 12 oz bottle of beer (355 mL), one 5 oz glass of wine (148 mL), or one 1 oz glass of hard liquor (44 mL). Activity  Include 30 minutes of exercise in your daily schedule. Exercise is a good stress reducer.  Include time in your day for an activity that you find relaxing. Try taking a walk, going on a bike ride, reading a book, or listening to music.  Schedule your time in a way that lowers stress, and keep a consistent schedule. Prioritize what is most important to get done.   General instructions  Get enough sleep. Try to go to sleep and get up at about the same time every day.  Take over-the-counter and prescription medicines only as told by your health care provider.  Do not use any products that contain nicotine or tobacco, such as cigarettes, e-cigarettes, and chewing tobacco. If you need help quitting, ask your health care provider.  Do not use drugs or smoke to cope with stress.  Keep all follow-up visits as told by your health care provider. This is important. Where to find support  Talk with your health care provider about stress management or finding a support group.  Find a therapist to work with you on your stress management techniques. Contact a health care provider if:  Your stress symptoms get worse.  You are unable to manage your stress at home.  You are struggling to stop using drugs or alcohol. Get help right away if:  You may be a danger to yourself or others.  You have any thoughts of death or suicide. If you ever feel like you may hurt yourself or others, or have thoughts about taking your own life, get help right away. You can go to your nearest emergency department or call:  Your local emergency services (911 in the [place]  A suicide crisis helpline, such as the [place] at (424)139-3033. This is open 24 hours a day. Summary  Feeling a certain amount of stress is normal, but severe  or long-lasting (chronic) stress can affect your mental and physical health.  Chronic stress can put you at higher risk for anxiety, depression, and other health problems like digestive problems, muscle aches, heart disease, high blood pressure, and stroke.  You may be at higher risk for stress-related problems if you do not get enough sleep, are in poor health, lack emotional support, or have a mental health disorder like anxiety or depression.  Identify the source of your stress and your reaction to it. Try talking about stressful events with family, friends, or co-workers, finding a coping method, or getting support from spiritual resources.  If you need more help, talk with your health care provider about finding a support group or a mental health therapist. This information is not intended to replace advice given to you by your health care provider. Make sure you discuss any questions you have with your health care provider. Document Revised: 03/22/2019 Document Reviewed: 03/22/2019 Elsevier Patient Education  [place].

## 2021-02-10 NOTE — Progress Notes (Signed)
Pt fainted and vomited after giving blood pt rested in the room for 45 min        She was given a cold compressed and water as well as crackers and Ondansetron 8mg  repeated bp it was 122 83 pt was driven home by her son.

## 2021-02-10 NOTE — Progress Notes (Signed)
Established Patient Office Visit  Subjective:  Patient ID: Julie Juarez, female    DOB: 07/19/1966  Age: 55 y.o. MRN: [id number]  CC:  Chief Complaint  Patient presents with   Establish Care    HPI Julie Juarez presents to establish new primary care provider. She has been routinely seeing her GYN provider for well woman care and mammograms. She has not had a primary care provider for some time. She believes she is due to have routine, fasting blood work. Feels as though it has been some time since this has been done. She is scheduled to have a screening colonoscopy in July 2022.  She has history of recurrent fever blisters. Has been some time she she has had one. Will take valacyclovir twice daily when she gets a flare up of fever blister.  She states that she has had increased family related stress. Finds that she is not sleeping as well as she should. She states that she falls asleep ok, but wakes up frequently during the night. She does have a white noise maker which she will listen to if she has trouble sleeping. It plays gentle nature sounds to help her calm her mind and get back to sleep. She states that her appetite is OK. Gets hungry and consumes a healthy diet. More concerned about family history of dementia. Her father does have Parkinson's disease. Was recently diagnosed with Parkinson's dementia.  The patient denies other concerns or complaints. She denies chest pain, chest pressure, or shortness of breath. She denies headaches or visual disturbances. She denies abdominal pain, nausea, vomiting, or changes in bowel or bladder habits.    Past Medical History:  Diagnosis Date   H/O uterine prolapse     Past Surgical History:  Procedure Laterality Date   CHOLECYSTECTOMY     CYST EXCISION     FROM LEFT SHOULDER   WISDOM TOOTH EXTRACTION      Family History  Problem Relation Age of Onset   Hypertension Father    Skin cancer Father    Hyperlipidemia Father     Hypertension Paternal Grandmother    Heart attack Paternal Grandmother    CVA Paternal Grandfather    Diabetes Paternal Grandfather    Stroke Paternal Grandfather    Epilepsy Cousin    Diabetes Maternal Grandfather     Social History   Socioeconomic History   Marital status: Married    Spouse name: Not on file   Number of children: Not on file   Years of education: Not on file   Highest education level: Not on file  Occupational History   Not on file  Tobacco Use   Smoking status: Never   Smokeless tobacco: Never  Vaping Use   Vaping Use: Never used  Substance and Sexual Activity   Alcohol use: No   Drug use: Never   Sexual activity: Yes    Comment: VASECTOMY  Other Topics Concern   Not on file  Social History Narrative   Not on file   Social Determinants of Health   Financial Resource Strain: Not on file  Food Insecurity: Not on file  Transportation Needs: Not on file  Physical Activity: Not on file  Stress: Not on file  Social Connections: Not on file  Intimate Partner Violence: Not on file    Outpatient Medications Prior to Visit  Medication Sig Dispense Refill   Cholecalciferol (VITAMIN D3) 25 MCG (1000 UT) CAPS Take 1 capsule by mouth daily.  Cyanocobalamin (VITAMIN B12) 500 MCG TABS Take 1 tablet by mouth daily.     vitamin C (ASCORBIC ACID) 500 MG tablet Take 500 mg by mouth daily.     No facility-administered medications prior to visit.    No Known Allergies  ROS Review of Systems  Constitutional:  Positive for fatigue. Negative for activity change, chills and fever.  HENT:  Negative for congestion, postnasal drip, rhinorrhea, sinus pressure and sinus pain.   Eyes: Negative.   Respiratory:  Negative for cough, chest tightness, shortness of breath and wheezing.   Cardiovascular:  Negative for chest pain and palpitations.  Gastrointestinal:  Negative for constipation, diarrhea, nausea and vomiting.  Endocrine: Negative for cold intolerance,  heat intolerance, polydipsia and polyuria.  Musculoskeletal:  Negative for back pain and myalgias.  Skin:  Negative for rash.  Allergic/Immunologic: Negative.   Neurological:  Negative for dizziness, weakness and headaches.  Hematological: Negative.   Psychiatric/Behavioral:  The patient is nervous/anxious.        Situational anxiety due to increased family related stress.      Objective:    Physical Exam Vitals and nursing note reviewed.  Constitutional:      Appearance: Normal appearance. She is well-developed.  HENT:     Head: Normocephalic and atraumatic.     Nose: Nose normal.     Mouth/Throat:     Mouth: Mucous membranes are moist.  Eyes:     Extraocular Movements: Extraocular movements intact.     Conjunctiva/sclera: Conjunctivae normal.     Pupils: Pupils are equal, round, and reactive to light.  Cardiovascular:     Rate and Rhythm: Normal rate and regular rhythm.     Pulses: Normal pulses.     Heart sounds: Normal heart sounds.  Pulmonary:     Effort: Pulmonary effort is normal.     Breath sounds: Normal breath sounds.  Abdominal:     Palpations: Abdomen is soft.  Musculoskeletal:        General: Normal range of motion.     Cervical back: Normal range of motion and neck supple.  Lymphadenopathy:     Cervical: No cervical adenopathy.  Skin:    General: Skin is warm and dry.     Capillary Refill: Capillary refill takes less than 2 seconds.  Neurological:     General: No focal deficit present.     Mental Status: She is alert and oriented to person, place, and time.  Psychiatric:        Mood and Affect: Mood normal.        Behavior: Behavior normal.        Thought Content: Thought content normal.        Judgment: Judgment normal.    Today's Vitals   02/10/21 0848 02/10/21 0914  BP: 109/70   Pulse: (!) 54 (!) 56  Temp: (!) 97.3 F (36.3 C)   SpO2: 99%   Weight: 150 lb (68 kg)   Height: _0  (1.575 m)    Body mass index is 27.44 kg/m.   Wt Readings  from Last 3 Encounters:  02/10/21 150 lb (68 kg)  09/29/18 138 lb 9.6 oz (62.9 kg)  08/25/18 137 lb 6.4 oz (62.3 kg)     Health Maintenance Due  Topic Date Due   HIV Screening  Never done   Hepatitis C Screening  Never done   COLONOSCOPY (Pts 45-41yr Insurance coverage will need to be confirmed)  Never done   Zoster Vaccines- Shingrix (1 of  2) Never done   TETANUS/TDAP  01/31/2020   MAMMOGRAM  10/04/2020   COVID-19 Vaccine (3 - Booster for Pfizer series) 01/24/2021    There are no preventive care reminders to display for this patient.  Lab Results  Component Value Date   TSH 1.180 02/10/2021   Lab Results  Component Value Date   WBC 4.6 02/10/2021   HGB 13.2 02/10/2021   HCT 39.5 02/10/2021   MCV 93 02/10/2021   PLT 237 02/10/2021   Lab Results  Component Value Date   NA 139 02/10/2021   K 4.2 02/10/2021   CO2 17 (L) 02/10/2021   GLUCOSE 82 02/10/2021   BUN 14 02/10/2021   CREATININE 0.78 02/10/2021   BILITOT 0.2 02/10/2021   ALKPHOS 87 02/10/2021   AST 18 02/10/2021   ALT 9 02/10/2021   PROT 7.1 02/10/2021   ALBUMIN 4.5 02/10/2021   CALCIUM 8.9 02/10/2021   EGFR 91 02/10/2021   Lab Results  Component Value Date   CHOL 188 02/10/2021   Lab Results  Component Value Date   HDL 47 02/10/2021   Lab Results  Component Value Date   LDLCALC 125 (H) 02/10/2021   Lab Results  Component Value Date   TRIG 87 02/10/2021   Lab Results  Component Value Date   CHOLHDL 4.0 02/10/2021   Lab Results  Component Value Date   HGBA1C 5.4 02/10/2021      Assessment & Plan:  1. Encounter to establish care Appointment today to establish new primary care provider.   2. Fever blister New prescription for valacyclovir to be taken twice daily for 5 days when needed for occurrence of fever blisters.  - valACYclovir (VALTREX) 1000 MG tablet; Take one tablet po BID when needed. Start taking as soon as fever blister is noted.  Dispense: 20 tablet; Refill: 0  3.  Other fatigue Check thyroid panel and HgbA1c for further evaluation. - T4, free - TSH - Hemoglobin A1c  4. Impaired fasting glucose Check fasting labs, including HgbA1c for further evaluation.  - Comprehensive metabolic panel - Lipid panel - Hemoglobin A1c  5. Vitamin D deficiency Check vitamin d level.  - Vitamin D 1,25 dihydroxy  6. Healthcare maintenance Check routine fasting labs drawn while patient is in the office.  - CBC with Differential/Platelet - Comprehensive metabolic panel - T4, free - TSH - Lipid panel   Problem List Items Addressed This Visit       Digestive   Fever blister   Relevant Medications   valACYclovir (VALTREX) 1000 MG tablet     Endocrine   Impaired fasting glucose   Relevant Orders   Comprehensive metabolic panel (Completed)   Lipid panel (Completed)   Hemoglobin A1c     Other   Encounter to establish care - Primary   Other fatigue   Relevant Orders   T4, free (Completed)   TSH (Completed)   Hemoglobin A1c (Completed)   Vitamin D deficiency   Relevant Orders   Vitamin D 1,25 dihydroxy (Completed)   Healthcare maintenance   Relevant Orders   CBC with Differential/Platelet (Completed)   Comprehensive metabolic panel (Completed)   T4, free (Completed)   TSH (Completed)   Lipid panel (Completed)    Meds ordered this encounter  Medications   valACYclovir (VALTREX) 1000 MG tablet    Sig: Take one tablet po BID when needed. Start taking as soon as fever blister is noted.    Dispense:  20 tablet    Refill:  0    Order Specific Question:   Supervising Provider    Answer:   Beatrice Lecher D [[id number]]    Follow-up: Return in about 6 months (around 08/12/2021) for physical .    Ronnell Freshwater, NP

## 2021-02-11 NOTE — Progress Notes (Signed)
So far, labs good. Waiting on vitamin d

## 2021-02-17 DIAGNOSIS — B001 Herpesviral vesicular dermatitis: Secondary | ICD-10-CM | POA: Insufficient documentation

## 2021-02-17 DIAGNOSIS — R5383 Other fatigue: Secondary | ICD-10-CM | POA: Insufficient documentation

## 2021-02-17 DIAGNOSIS — Z0000 Encounter for general adult medical examination without abnormal findings: Secondary | ICD-10-CM | POA: Insufficient documentation

## 2021-02-17 DIAGNOSIS — Z7689 Persons encountering health services in other specified circumstances: Secondary | ICD-10-CM | POA: Insufficient documentation

## 2021-02-17 DIAGNOSIS — E559 Vitamin D deficiency, unspecified: Secondary | ICD-10-CM | POA: Insufficient documentation

## 2021-02-17 DIAGNOSIS — R7301 Impaired fasting glucose: Secondary | ICD-10-CM | POA: Insufficient documentation

## 2021-02-18 LAB — COMPREHENSIVE METABOLIC PANEL
ALT: 9 IU/L (ref 0–32)
AST: 18 IU/L (ref 0–40)
Albumin/Globulin Ratio: 1.7 (ref 1.2–2.2)
Albumin: 4.5 g/dL (ref 3.8–4.9)
Alkaline Phosphatase: 87 IU/L (ref 44–121)
BUN/Creatinine Ratio: 18 (ref 9–23)
BUN: 14 mg/dL (ref 6–24)
Bilirubin Total: 0.2 mg/dL (ref 0.0–1.2)
CO2: 17 mmol/L — ABNORMAL LOW (ref 20–29)
Calcium: 8.9 mg/dL (ref 8.7–10.2)
Chloride: 100 mmol/L (ref 96–106)
Creatinine, Ser: 0.78 mg/dL (ref 0.57–1.00)
Globulin, Total: 2.6 g/dL (ref 1.5–4.5)
Glucose: 82 mg/dL (ref 65–99)
Potassium: 4.2 mmol/L (ref 3.5–5.2)
Sodium: 139 mmol/L (ref 134–144)
Total Protein: 7.1 g/dL (ref 6.0–8.5)
eGFR: 91 mL/min/{1.73_m2} (ref >59)

## 2021-02-18 LAB — CBC WITH DIFFERENTIAL/PLATELET
Basophils Absolute: 0.1 10*3/uL (ref 0.0–0.2)
Basos: 1 %
EOS (ABSOLUTE): 0.2 10*3/uL (ref 0.0–0.4)
Eos: 3 %
Hematocrit: 39.5 % (ref 34.0–46.6)
Hemoglobin: 13.2 g/dL (ref 11.1–15.9)
Immature Grans (Abs): 0 10*3/uL (ref 0.0–0.1)
Immature Granulocytes: 0 %
Lymphocytes Absolute: 1.4 10*3/uL (ref 0.7–3.1)
Lymphs: 30 %
MCH: 31.1 pg (ref 26.6–33.0)
MCHC: 33.4 g/dL (ref 31.5–35.7)
MCV: 93 fL (ref 79–97)
Monocytes Absolute: 0.3 10*3/uL (ref 0.1–0.9)
Monocytes: 7 %
Neutrophils Absolute: 2.7 10*3/uL (ref 1.4–7.0)
Neutrophils: 59 %
Platelets: 237 10*3/uL (ref 150–450)
RBC: 4.24 x10E6/uL (ref 3.77–5.28)
RDW: 12.3 % (ref 11.7–15.4)
WBC: 4.6 10*3/uL (ref 3.4–10.8)

## 2021-02-18 LAB — VITAMIN D 1,25 DIHYDROXY
Vitamin D 1, 25 (OH)2 Total: 59 pg/mL
Vitamin D2 1, 25 (OH)2: <10 pg/mL
Vitamin D3 1, 25 (OH)2: 59 pg/mL

## 2021-02-18 LAB — LIPID PANEL
Chol/HDL Ratio: 4 ratio (ref 0.0–4.4)
Cholesterol, Total: 188 mg/dL (ref 100–199)
HDL: 47 mg/dL (ref >39)
LDL Chol Calc (NIH): 125 mg/dL — ABNORMAL HIGH (ref 0–99)
Triglycerides: 87 mg/dL (ref 0–149)
VLDL Cholesterol Cal: 16 mg/dL (ref 5–40)

## 2021-02-18 LAB — HEMOGLOBIN A1C
Est. average glucose Bld gHb Est-mCnc: 108 mg/dL
Hgb A1c MFr Bld: 5.4 % (ref 4.8–5.6)

## 2021-02-18 LAB — TSH: TSH: 1.18 u[IU]/mL (ref 0.450–4.500)

## 2021-02-18 LAB — T4, FREE: Free T4: 1.21 ng/dL (ref 0.82–1.77)

## 2021-02-19 ENCOUNTER — Encounter: Payer: Self-pay | Admitting: Nurse Practitioner

## 2021-02-19 NOTE — Progress Notes (Signed)
Very mild elevation of LDL. All other labs were good. MyChart message sent to patient.

## 2022-01-15 DIAGNOSIS — D225 Melanocytic nevi of trunk: Secondary | ICD-10-CM | POA: Diagnosis not present

## 2022-01-15 DIAGNOSIS — L814 Other melanin hyperpigmentation: Secondary | ICD-10-CM | POA: Diagnosis not present

## 2022-01-15 DIAGNOSIS — L821 Other seborrheic keratosis: Secondary | ICD-10-CM | POA: Diagnosis not present

## 2022-01-15 DIAGNOSIS — D2262 Melanocytic nevi of left upper limb, including shoulder: Secondary | ICD-10-CM | POA: Diagnosis not present

## 2022-05-04 DIAGNOSIS — Z01419 Encounter for gynecological examination (general) (routine) without abnormal findings: Secondary | ICD-10-CM | POA: Diagnosis not present

## 2022-05-04 DIAGNOSIS — Z1231 Encounter for screening mammogram for malignant neoplasm of breast: Secondary | ICD-10-CM | POA: Diagnosis not present

## 2022-06-03 ENCOUNTER — Other Ambulatory Visit: Payer: Self-pay | Admitting: Diagnostic Radiology

## 2022-06-03 DIAGNOSIS — N6489 Other specified disorders of breast: Secondary | ICD-10-CM

## 2022-06-24 ENCOUNTER — Other Ambulatory Visit: Payer: Self-pay | Admitting: Obstetrics and Gynecology

## 2022-06-24 ENCOUNTER — Other Ambulatory Visit: Payer: Self-pay | Admitting: Diagnostic Radiology

## 2022-06-24 ENCOUNTER — Other Ambulatory Visit: Payer: Self-pay | Admitting: Cardiothoracic Surgery

## 2022-06-24 DIAGNOSIS — R928 Other abnormal and inconclusive findings on diagnostic imaging of breast: Secondary | ICD-10-CM

## 2022-06-29 ENCOUNTER — Ambulatory Visit
Admission: RE | Admit: 2022-06-29 | Discharge: 2022-06-29 | Disposition: A | Discharge status: Home / Self Care | Payer: BC Managed Care – PPO | Source: Ambulatory Visit | Attending: Diagnostic Radiology | Admitting: Diagnostic Radiology

## 2022-06-29 ENCOUNTER — Other Ambulatory Visit: Payer: Self-pay | Admitting: Obstetrics and Gynecology

## 2022-06-29 DIAGNOSIS — N6321 Unspecified lump in the left breast, upper outer quadrant: Secondary | ICD-10-CM | POA: Diagnosis not present

## 2022-06-29 DIAGNOSIS — N6489 Other specified disorders of breast: Secondary | ICD-10-CM

## 2022-06-29 DIAGNOSIS — R928 Other abnormal and inconclusive findings on diagnostic imaging of breast: Secondary | ICD-10-CM

## 2022-06-29 DIAGNOSIS — R92322 Mammographic fibroglandular density, left breast: Secondary | ICD-10-CM | POA: Diagnosis not present

## 2022-06-29 DIAGNOSIS — N6001 Solitary cyst of right breast: Secondary | ICD-10-CM

## 2022-07-15 ENCOUNTER — Ambulatory Visit
Admission: RE | Admit: 2022-07-15 | Discharge: 2022-07-15 | Disposition: A | Discharge status: Home / Self Care | Payer: BC Managed Care – PPO | Source: Ambulatory Visit | Attending: Obstetrics and Gynecology | Admitting: Obstetrics and Gynecology

## 2022-07-15 DIAGNOSIS — R92332 Mammographic heterogeneous density, left breast: Secondary | ICD-10-CM | POA: Diagnosis not present

## 2022-07-15 DIAGNOSIS — N6001 Solitary cyst of right breast: Secondary | ICD-10-CM

## 2022-07-15 DIAGNOSIS — N6489 Other specified disorders of breast: Secondary | ICD-10-CM

## 2022-07-15 DIAGNOSIS — N6002 Solitary cyst of left breast: Secondary | ICD-10-CM | POA: Diagnosis not present

## 2022-07-15 LAB — HM MAMMOGRAPHY

## 2022-09-29 DIAGNOSIS — L820 Inflamed seborrheic keratosis: Secondary | ICD-10-CM | POA: Diagnosis not present

## 2023-02-11 ENCOUNTER — Ambulatory Visit: Payer: BC Managed Care – PPO | Admitting: Family Medicine

## 2023-02-11 ENCOUNTER — Encounter: Payer: Self-pay | Admitting: Family Medicine

## 2023-02-11 VITALS — BP 137/90 | HR 60 | Resp 20 | Ht 62.0 in | Wt 165.0 lb

## 2023-02-11 DIAGNOSIS — B001 Herpesviral vesicular dermatitis: Secondary | ICD-10-CM

## 2023-02-11 DIAGNOSIS — Z130 Encounter for screening for diseases of the blood and blood-forming organs and certain disorders involving the immune mechanism: Secondary | ICD-10-CM

## 2023-02-11 DIAGNOSIS — E559 Vitamin D deficiency, unspecified: Secondary | ICD-10-CM | POA: Diagnosis not present

## 2023-02-11 DIAGNOSIS — R030 Elevated blood-pressure reading, without diagnosis of hypertension: Secondary | ICD-10-CM | POA: Diagnosis not present

## 2023-02-11 DIAGNOSIS — R413 Other amnesia: Secondary | ICD-10-CM | POA: Diagnosis not present

## 2023-02-11 DIAGNOSIS — Z23 Encounter for immunization: Secondary | ICD-10-CM | POA: Diagnosis not present

## 2023-02-11 DIAGNOSIS — E538 Deficiency of other specified B group vitamins: Secondary | ICD-10-CM

## 2023-02-11 DIAGNOSIS — Z7689 Persons encountering health services in other specified circumstances: Secondary | ICD-10-CM

## 2023-02-11 DIAGNOSIS — E782 Mixed hyperlipidemia: Secondary | ICD-10-CM

## 2023-02-11 DIAGNOSIS — Z820 Family history of epilepsy and other diseases of the nervous system: Secondary | ICD-10-CM

## 2023-02-11 MED ORDER — VALACYCLOVIR HCL 1 G PO TABS: ORAL_TABLET | ORAL | 0 refills | Status: AC

## 2023-02-11 NOTE — Patient Instructions (Signed)
ADHD evaluations: [facility]

## 2023-02-11 NOTE — Assessment & Plan Note (Signed)
BP initially 156/81, on repeat 137/90. If BP elevated again at next appointment, diagnose HTN.

## 2023-02-11 NOTE — Progress Notes (Signed)
New Patient Office Visit  Subjective    Patient ID: Julie Juarez, female    DOB: Apr 20, 1966  Age: 57 y.o. MRN: [id number]  CC:  Chief Complaint  Patient presents with   Establish Care    HPI Julie Juarez presents to establish care. She was here on 02/10/2021 but has not returned since then. She sees her OBGYN for annual well woman exams and mammograms. Past medical history includes HLD, impaired fasting glucose. She has concerns about her memory, especially since her father has a history of Parkinson's dementia. He also had a stroke and she wants to make sure that she is doing everything she can to prevent a stroke for herself. She does see a dermatologist annually.   Outpatient Encounter Medications as of 02/11/2023  Medication Sig   [DISCONTINUED] valACYclovir (VALTREX) 1000 MG tablet Take one tablet po BID when needed. Start taking as soon as fever blister is noted.   valACYclovir (VALTREX) 1000 MG tablet Take one tablet po BID when needed. Start taking as soon as fever blister is noted.   [DISCONTINUED] Cholecalciferol (VITAMIN D3) 25 MCG (1000 UT) CAPS Take 1 capsule by mouth daily. (Patient not taking: Reported on 02/11/2023)   [DISCONTINUED] Cyanocobalamin (VITAMIN B12) 500 MCG TABS Take 1 tablet by mouth daily. (Patient not taking: Reported on 02/11/2023)   [DISCONTINUED] vitamin C (ASCORBIC ACID) 500 MG tablet Take 500 mg by mouth daily. (Patient not taking: Reported on 02/11/2023)   No facility-administered encounter medications on file as of 02/11/2023.    Past Medical History:  Diagnosis Date   H/O uterine prolapse     Past Surgical History:  Procedure Laterality Date   CHOLECYSTECTOMY     CYST EXCISION     FROM LEFT SHOULDER   WISDOM TOOTH EXTRACTION      Family History  Problem Relation Age of Onset   Hypertension Father    Skin cancer Father    Hyperlipidemia Father    Parkinson's disease Father        Parkinson's dementia   Diabetes Maternal Grandfather     Hypertension Paternal Grandmother    Heart attack Paternal Grandmother    CVA Paternal Grandfather    Diabetes Paternal Grandfather    Stroke Paternal Grandfather    Epilepsy Cousin     Social History   Socioeconomic History   Marital status: Married    Spouse name: Not on file   Number of children: Not on file   Years of education: Not on file   Highest education level: Not on file  Occupational History   Not on file  Tobacco Use   Smoking status: Never    Passive exposure: Never   Smokeless tobacco: Never  Vaping Use   Vaping Use: Never used  Substance and Sexual Activity   Alcohol use: No   Drug use: Never   Sexual activity: Yes    Comment: VASECTOMY  Other Topics Concern   Not on file  Social History Narrative   Not on file   Social Determinants of Health   Financial Resource Strain: Not on file  Food Insecurity: Not on file  Transportation Needs: Not on file  Physical Activity: Not on file  Stress: Not on file  Social Connections: Not on file  Intimate Partner Violence: Not on file    Review of Systems  Constitutional:  Negative for chills, fever and malaise/fatigue.  HENT:  Negative for congestion and hearing loss.   Eyes:  Negative for blurred vision and  double vision.  Respiratory:  Negative for cough and shortness of breath.   Cardiovascular:  Negative for chest pain, palpitations and leg swelling.  Gastrointestinal:  Negative for abdominal pain, constipation, diarrhea and heartburn.  Genitourinary:  Negative for frequency and urgency.  Musculoskeletal:  Negative for myalgias and neck pain.  Neurological:  Negative for headaches.       Memory changes  Endo/Heme/Allergies:  Negative for polydipsia.  Psychiatric/Behavioral:  Negative for depression. The patient is not nervous/anxious.     Objective    BP (!) 137/90 (BP Location: Right Arm, Patient Position: Sitting, Cuff Size: Normal)   Pulse 60   Resp 20   Ht 5\' 2"  (1.575 m)   Wt 165 lb (74.8  kg)   SpO2 100%   BMI 30.18 kg/m   Physical Exam Constitutional:      General: She is not in acute distress.    Appearance: Normal appearance. She is not ill-appearing.  HENT:     Head: Normocephalic and atraumatic.     Right Ear: Tympanic membrane, ear canal and external ear normal.     Left Ear: Tympanic membrane, ear canal and external ear normal.     Nose: Nose normal. No congestion or rhinorrhea.     Mouth/Throat:     Mouth: Mucous membranes are moist.     Pharynx: Oropharynx is clear. No oropharyngeal exudate or posterior oropharyngeal erythema.  Eyes:     General:        Right eye: No discharge.        Left eye: No discharge.     Conjunctiva/sclera: Conjunctivae normal.     Pupils: Pupils are equal, round, and reactive to light.  Cardiovascular:     Rate and Rhythm: Normal rate and regular rhythm.     Pulses: Normal pulses.     Heart sounds: No murmur heard.    No friction rub. No gallop.  Pulmonary:     Effort: Pulmonary effort is normal. No respiratory distress.     Breath sounds: Normal breath sounds. No wheezing, rhonchi or rales.  Skin:    General: Skin is warm and dry.  Neurological:     Mental Status: She is alert and oriented to person, place, and time.     Assessment & Plan:  Encounter to establish care -     CBC with Differential/Platelet; Future -     Comprehensive metabolic panel; Future -     Hemoglobin A1c; Future -     Lipid panel; Future -     VITAMIN D 25 Hydroxy (Vit-D Deficiency, Fractures); Future -     TSH; Future -     T4, free; Future -     Vitamin B12; Future  Need for Tdap vaccination -     Tdap vaccine greater than or equal to 7yo IM  Memory change -     Ambulatory referral to Neurology  Family history of Parkinson's disease -     Ambulatory referral to Neurology  Elevated blood pressure reading Assessment & Plan: BP initially 156/81, on repeat 137/90. If BP elevated again at next appointment, diagnose HTN.   Moderate  mixed hyperlipidemia not requiring statin therapy -     Lipid panel; Future  Vitamin D deficiency -     VITAMIN D 25 Hydroxy (Vit-D Deficiency, Fractures); Future  Vitamin B12 deficiency -     Vitamin B12; Future  Screening for endocrine, nutritional, metabolic and immunity disorder -     CBC with Differential/Platelet;  Future -     Comprehensive metabolic panel; Future -     Hemoglobin A1c; Future -     Lipid panel; Future -     VITAMIN D 25 Hydroxy (Vit-D Deficiency, Fractures); Future -     TSH; Future -     T4, free; Future -     Vitamin B12; Future  Fever blister -     valACYclovir HCl; Take one tablet po BID when needed. Start taking as soon as fever blister is noted.  Dispense: 20 tablet; Refill: 0  Baseline labs in the next couple of weeks.  Continue annual follow-up with OB/GYN and dermatology.  Provided referral to neurology for recent memory changes and family history of Parkinson's dementia.  Return in about 6 months (around 08/13/2023) for annual physical, fasting blood work 1 week before.   Melida Quitter, PA

## 2023-02-15 ENCOUNTER — Other Ambulatory Visit: Payer: BC Managed Care – PPO

## 2023-02-15 DIAGNOSIS — Z1329 Encounter for screening for other suspected endocrine disorder: Secondary | ICD-10-CM | POA: Diagnosis not present

## 2023-02-15 DIAGNOSIS — E782 Mixed hyperlipidemia: Secondary | ICD-10-CM | POA: Diagnosis not present

## 2023-02-15 DIAGNOSIS — Z1321 Encounter for screening for nutritional disorder: Secondary | ICD-10-CM | POA: Diagnosis not present

## 2023-02-15 DIAGNOSIS — E559 Vitamin D deficiency, unspecified: Secondary | ICD-10-CM

## 2023-02-15 DIAGNOSIS — Z7689 Persons encountering health services in other specified circumstances: Secondary | ICD-10-CM

## 2023-02-15 DIAGNOSIS — E538 Deficiency of other specified B group vitamins: Secondary | ICD-10-CM | POA: Diagnosis not present

## 2023-02-16 LAB — COMPREHENSIVE METABOLIC PANEL
ALT: 20 IU/L (ref 0–32)
AST: 21 IU/L (ref 0–40)
Albumin/Globulin Ratio: 1.5
Albumin: 4.4 g/dL (ref 3.8–4.9)
Alkaline Phosphatase: 110 IU/L (ref 44–121)
BUN/Creatinine Ratio: 18 (ref 9–23)
BUN: 13 mg/dL (ref 6–24)
Bilirubin Total: 0.3 mg/dL (ref 0.0–1.2)
CO2: 22 mmol/L (ref 20–29)
Calcium: 9.6 mg/dL (ref 8.7–10.2)
Chloride: 103 mmol/L (ref 96–106)
Creatinine, Ser: 0.74 mg/dL (ref 0.57–1.00)
Globulin, Total: 2.9 g/dL (ref 1.5–4.5)
Glucose: 95 mg/dL (ref 70–99)
Potassium: 4.4 mmol/L (ref 3.5–5.2)
Sodium: 140 mmol/L (ref 134–144)
Total Protein: 7.3 g/dL (ref 6.0–8.5)
eGFR: 95 mL/min/{1.73_m2} (ref >59)

## 2023-02-16 LAB — CBC WITH DIFFERENTIAL/PLATELET
Basophils Absolute: 0 10*3/uL (ref 0.0–0.2)
Basos: 1 %
EOS (ABSOLUTE): 0.2 10*3/uL (ref 0.0–0.4)
Eos: 5 %
Hematocrit: 42.7 % (ref 34.0–46.6)
Hemoglobin: 13.9 g/dL (ref 11.1–15.9)
Immature Grans (Abs): 0 10*3/uL (ref 0.0–0.1)
Immature Granulocytes: 0 %
Lymphocytes Absolute: 1.3 10*3/uL (ref 0.7–3.1)
Lymphs: 31 %
MCH: 30.2 pg (ref 26.6–33.0)
MCHC: 32.6 g/dL (ref 31.5–35.7)
MCV: 93 fL (ref 79–97)
Monocytes Absolute: 0.3 10*3/uL (ref 0.1–0.9)
Monocytes: 7 %
Neutrophils Absolute: 2.4 10*3/uL (ref 1.4–7.0)
Neutrophils: 56 %
Platelets: 252 10*3/uL (ref 150–450)
RBC: 4.6 x10E6/uL (ref 3.77–5.28)
RDW: 12.9 % (ref 11.7–15.4)
WBC: 4.2 10*3/uL (ref 3.4–10.8)

## 2023-02-16 LAB — VITAMIN B12: Vitamin B-12: 1759 pg/mL — ABNORMAL HIGH (ref 232–1245)

## 2023-02-16 LAB — HEMOGLOBIN A1C
Est. average glucose Bld gHb Est-mCnc: 114 mg/dL
Hgb A1c MFr Bld: 5.6 % (ref 4.8–5.6)

## 2023-02-16 LAB — LIPID PANEL
Chol/HDL Ratio: 4.8 ratio — ABNORMAL HIGH (ref 0.0–4.4)
Cholesterol, Total: 220 mg/dL — ABNORMAL HIGH (ref 100–199)
HDL: 46 mg/dL (ref >39)
LDL Chol Calc (NIH): 153 mg/dL — ABNORMAL HIGH (ref 0–99)
Triglycerides: 118 mg/dL (ref 0–149)
VLDL Cholesterol Cal: 21 mg/dL (ref 5–40)

## 2023-02-16 LAB — TSH: TSH: 1.76 u[IU]/mL (ref 0.450–4.500)

## 2023-02-16 LAB — VITAMIN D 25 HYDROXY (VIT D DEFICIENCY, FRACTURES): Vit D, 25-Hydroxy: 71.9 ng/mL (ref 30.0–100.0)

## 2023-02-16 LAB — T4, FREE: Free T4: 1.05 ng/dL (ref 0.82–1.77)

## 2023-03-22 DIAGNOSIS — L821 Other seborrheic keratosis: Secondary | ICD-10-CM | POA: Diagnosis not present

## 2023-03-22 DIAGNOSIS — L814 Other melanin hyperpigmentation: Secondary | ICD-10-CM | POA: Diagnosis not present

## 2023-03-22 DIAGNOSIS — D485 Neoplasm of uncertain behavior of skin: Secondary | ICD-10-CM | POA: Diagnosis not present

## 2023-03-22 DIAGNOSIS — D225 Melanocytic nevi of trunk: Secondary | ICD-10-CM | POA: Diagnosis not present

## 2023-03-22 DIAGNOSIS — D2362 Other benign neoplasm of skin of left upper limb, including shoulder: Secondary | ICD-10-CM | POA: Diagnosis not present

## 2023-07-22 ENCOUNTER — Other Ambulatory Visit: Payer: Self-pay

## 2023-07-22 DIAGNOSIS — E782 Mixed hyperlipidemia: Secondary | ICD-10-CM

## 2023-07-22 DIAGNOSIS — Z0000 Encounter for general adult medical examination without abnormal findings: Secondary | ICD-10-CM

## 2023-08-09 ENCOUNTER — Other Ambulatory Visit: Payer: BC Managed Care – PPO

## 2023-08-09 DIAGNOSIS — E782 Mixed hyperlipidemia: Secondary | ICD-10-CM

## 2023-08-09 DIAGNOSIS — Z0000 Encounter for general adult medical examination without abnormal findings: Secondary | ICD-10-CM | POA: Diagnosis not present

## 2023-08-10 LAB — HEMOGLOBIN A1C
Est. average glucose Bld gHb Est-mCnc: 120 mg/dL
Hgb A1c MFr Bld: 5.8 % — ABNORMAL HIGH (ref 4.8–5.6)

## 2023-08-10 LAB — LIPID PANEL
Chol/HDL Ratio: 4.6 {ratio} — ABNORMAL HIGH (ref 0.0–4.4)
Cholesterol, Total: 218 mg/dL — ABNORMAL HIGH (ref 100–199)
HDL: 47 mg/dL (ref >39)
LDL Chol Calc (NIH): 152 mg/dL — ABNORMAL HIGH (ref 0–99)
Triglycerides: 107 mg/dL (ref 0–149)
VLDL Cholesterol Cal: 19 mg/dL (ref 5–40)

## 2023-08-10 LAB — COMPREHENSIVE METABOLIC PANEL
ALT: 13 [IU]/L (ref 0–32)
AST: 16 [IU]/L (ref 0–40)
Albumin: 4.2 g/dL (ref 3.8–4.9)
Alkaline Phosphatase: 105 [IU]/L (ref 44–121)
BUN/Creatinine Ratio: 25 — ABNORMAL HIGH (ref 9–23)
BUN: 18 mg/dL (ref 6–24)
Bilirubin Total: 0.3 mg/dL (ref 0.0–1.2)
CO2: 22 mmol/L (ref 20–29)
Calcium: 9 mg/dL (ref 8.7–10.2)
Chloride: 105 mmol/L (ref 96–106)
Creatinine, Ser: 0.72 mg/dL (ref 0.57–1.00)
Globulin, Total: 2.6 g/dL (ref 1.5–4.5)
Glucose: 99 mg/dL (ref 70–99)
Potassium: 4.4 mmol/L (ref 3.5–5.2)
Sodium: 141 mmol/L (ref 134–144)
Total Protein: 6.8 g/dL (ref 6.0–8.5)
eGFR: 99 mL/min/{1.73_m2} (ref >59)

## 2023-08-10 LAB — CBC WITH DIFFERENTIAL/PLATELET
Basophils Absolute: 0 10*3/uL (ref 0.0–0.2)
Basos: 1 %
EOS (ABSOLUTE): 0.2 10*3/uL (ref 0.0–0.4)
Eos: 4 %
Hematocrit: 38.8 % (ref 34.0–46.6)
Hemoglobin: 13 g/dL (ref 11.1–15.9)
Immature Grans (Abs): 0 10*3/uL (ref 0.0–0.1)
Immature Granulocytes: 0 %
Lymphocytes Absolute: 1.3 10*3/uL (ref 0.7–3.1)
Lymphs: 33 %
MCH: 31 pg (ref 26.6–33.0)
MCHC: 33.5 g/dL (ref 31.5–35.7)
MCV: 93 fL (ref 79–97)
Monocytes Absolute: 0.3 10*3/uL (ref 0.1–0.9)
Monocytes: 7 %
Neutrophils Absolute: 2.2 10*3/uL (ref 1.4–7.0)
Neutrophils: 55 %
Platelets: 223 10*3/uL (ref 150–450)
RBC: 4.19 x10E6/uL (ref 3.77–5.28)
RDW: 12.4 % (ref 11.7–15.4)
WBC: 4 10*3/uL (ref 3.4–10.8)

## 2023-08-11 ENCOUNTER — Other Ambulatory Visit: Payer: Self-pay | Admitting: Obstetrics and Gynecology

## 2023-08-11 DIAGNOSIS — Z1231 Encounter for screening mammogram for malignant neoplasm of breast: Secondary | ICD-10-CM

## 2023-08-12 DIAGNOSIS — Z01419 Encounter for gynecological examination (general) (routine) without abnormal findings: Secondary | ICD-10-CM | POA: Diagnosis not present

## 2023-08-12 DIAGNOSIS — Z1330 Encounter for screening examination for mental health and behavioral disorders, unspecified: Secondary | ICD-10-CM | POA: Diagnosis not present

## 2023-08-16 ENCOUNTER — Encounter: Payer: Self-pay | Admitting: Family Medicine

## 2023-08-16 ENCOUNTER — Ambulatory Visit (INDEPENDENT_AMBULATORY_CARE_PROVIDER_SITE_OTHER): Payer: BC Managed Care – PPO | Admitting: Family Medicine

## 2023-08-16 VITALS — BP 128/88 | HR 58 | Resp 18 | Ht 62.0 in | Wt 157.0 lb

## 2023-08-16 DIAGNOSIS — E559 Vitamin D deficiency, unspecified: Secondary | ICD-10-CM | POA: Diagnosis not present

## 2023-08-16 DIAGNOSIS — E538 Deficiency of other specified B group vitamins: Secondary | ICD-10-CM

## 2023-08-16 DIAGNOSIS — Z0000 Encounter for general adult medical examination without abnormal findings: Secondary | ICD-10-CM

## 2023-08-16 DIAGNOSIS — E782 Mixed hyperlipidemia: Secondary | ICD-10-CM

## 2023-08-16 DIAGNOSIS — Z1159 Encounter for screening for other viral diseases: Secondary | ICD-10-CM

## 2023-08-16 NOTE — Assessment & Plan Note (Signed)
Last lipid panel: LDL 152, HDL 47, triglycerides 107. The 10-year ASCVD risk score (Arnett DK, et al., 2019) is: 2.5%.  Continue nonpharmacologic management with routine physical activity and limiting trans and saturated fats.  Will continue to monitor periodically.

## 2023-08-16 NOTE — Progress Notes (Signed)
Complete physical exam  Patient: Julie Juarez   DOB: 1966/05/03   57 y.o. Female  MRN: [id number]  Subjective:    Chief Complaint  Patient presents with   Annual Exam    Julie Juarez is a 57 y.o. female who presents today for a complete physical exam. She reports consuming a general diet.  She generally feels well. She reports sleeping fairly well but does wake up several times each night.  She states that is something that she can deal with at this point. She does not have additional problems to discuss today.  After seeing her lab results, she is going to make a more conscious effort in her nutrition and physical activity to lower her cholesterol levels since she knows that she has a significant family history.   Most recent fall risk assessment:    02/11/2023    3:11 PM  Fall Risk   Falls in the past year? 0  Number falls in past yr: 0  Injury with Fall? 0  Risk for fall due to : No Fall Risks  Follow up Falls evaluation completed     Most recent depression and anxiety screenings:    08/16/2023    8:21 AM 02/11/2023    3:11 PM  PHQ 2/9 Scores  PHQ - 2 Score 0 0  PHQ- 9 Score 3 5      08/16/2023    8:21 AM 02/11/2023    3:11 PM 02/10/2021    8:52 AM  GAD 7 : Generalized Anxiety Score  Nervous, Anxious, on Edge 1 0 0  Control/stop worrying 0 0 0  Worry too much - different things 1 0 0  Trouble relaxing 1 1 1   Restless 0 0 0  Easily annoyed or irritable 0 0 0  Afraid - awful might happen 0 0 0  Total GAD 7 Score 3 1 1   Anxiety Difficulty Not difficult at all Not difficult at all     Patient Active Problem List   Diagnosis Date Noted   Elevated blood pressure reading 02/11/2023   Fever blister 02/17/2021   Impaired fasting glucose 02/17/2021   Vitamin D deficiency 02/17/2021   H/O uterine prolapse 08/25/2018   Moderate mixed hyperlipidemia not requiring statin therapy 08/25/2018    Past Surgical History:  Procedure Laterality Date   CHOLECYSTECTOMY      CYST EXCISION     FROM LEFT SHOULDER   WISDOM TOOTH EXTRACTION     Social History   Tobacco Use   Smoking status: Never    Passive exposure: Never   Smokeless tobacco: Never  Vaping Use   Vaping status: Never Used  Substance Use Topics   Alcohol use: No   Drug use: Never   Family History  Problem Relation Age of Onset   Hypertension Father    Skin cancer Father    Hyperlipidemia Father    Parkinson's disease Father        Parkinson's dementia   Cancer Father    Diabetes Father    Hearing loss Father    Stroke Father    Diabetes Maternal Grandfather    Hypertension Paternal Grandmother    Heart attack Paternal Grandmother    CVA Paternal Grandfather    Diabetes Paternal Grandfather    Stroke Paternal Grandfather    Epilepsy Cousin    Hypertension Mother    ADD / ADHD Son    Anxiety disorder Daughter    Hearing loss Daughter    No Known Allergies  Patient Care Team: Melida Quitter, PA as PCP - General (Family Medicine) Bufford Buttner, MD as Referring Physician (Dermatology) Yvette Rack, OD (Optometry) Ob/Gyn, [facility] (Obstetrics and Gynecology)   Outpatient Medications Prior to Visit  Medication Sig   valACYclovir (VALTREX) 1000 MG tablet Take one tablet po BID when needed. Start taking as soon as fever blister is noted.   No facility-administered medications prior to visit.    Review of Systems  Constitutional:  Negative for chills, fever and malaise/fatigue.  HENT:  Negative for congestion and hearing loss.   Eyes:  Negative for blurred vision and double vision.  Respiratory:  Negative for cough and shortness of breath.   Cardiovascular:  Negative for chest pain, palpitations and leg swelling.  Gastrointestinal:  Negative for abdominal pain, constipation, diarrhea and heartburn.  Genitourinary:  Negative for frequency and urgency.  Musculoskeletal:  Negative for myalgias and neck pain.  Neurological:  Negative for headaches.   Endo/Heme/Allergies:  Negative for polydipsia.  Psychiatric/Behavioral:  Negative for depression. The patient has insomnia. The patient is not nervous/anxious.       Objective:    BP 128/88 (BP Location: Left Arm, Patient Position: Sitting, Cuff Size: Normal)   Pulse (!) 58   Resp 18   Ht 5\' 2"  (1.575 m)   Wt 157 lb (71.2 kg)   SpO2 97%   BMI 28.72 kg/m    Physical Exam Constitutional:      General: She is not in acute distress.    Appearance: Normal appearance.  HENT:     Head: Normocephalic and atraumatic.     Right Ear: Tympanic membrane, ear canal and external ear normal.     Left Ear: Tympanic membrane, ear canal and external ear normal.     Nose: Nose normal. No congestion or rhinorrhea.     Mouth/Throat:     Mouth: Mucous membranes are moist.     Pharynx: No oropharyngeal exudate or posterior oropharyngeal erythema.  Eyes:     Extraocular Movements: Extraocular movements intact.     Conjunctiva/sclera: Conjunctivae normal.     Pupils: Pupils are equal, round, and reactive to light.     Comments: Wears glasses  Neck:     Thyroid: No thyroid mass, thyromegaly or thyroid tenderness.  Cardiovascular:     Rate and Rhythm: Normal rate and regular rhythm.     Heart sounds: Normal heart sounds. No murmur heard.    No friction rub. No gallop.  Pulmonary:     Effort: Pulmonary effort is normal. No respiratory distress.     Breath sounds: Normal breath sounds. No wheezing, rhonchi or rales.  Abdominal:     General: Abdomen is flat. Bowel sounds are normal. There is no distension.     Palpations: There is no mass.     Tenderness: There is no abdominal tenderness. There is no guarding.  Musculoskeletal:        General: Normal range of motion.     Cervical back: Normal range of motion and neck supple.  Lymphadenopathy:     Cervical: No cervical adenopathy.  Skin:    General: Skin is warm and dry.  Neurological:     Mental Status: She is alert and oriented to person,  place, and time.     Cranial Nerves: No cranial nerve deficit.     Motor: No weakness.     Deep Tendon Reflexes: Reflexes normal.  Psychiatric:        Mood and Affect: Mood normal.  Assessment & Plan:    Routine Health Maintenance and Physical Exam  Immunization History  Administered Date(s) Administered   Influenza,inj,Quad PF,6+ Mos 08/25/2018   Influenza-Unspecified 08/25/2018   PFIZER(Purple Top)SARS-COV-2 Vaccination 08/05/2020, 08/26/2020   Tdap 01/30/2010, 02/11/2023    Health Maintenance  Topic Date Due   HIV Screening  Never done   Hepatitis C Screening  Never done   Zoster Vaccines- Shingrix (1 of 2) Never done   Colonoscopy  Never done   Cervical Cancer Screening (HPV/Pap Cotest)  10/04/2021   COVID-19 Vaccine (3 - Pfizer risk series) 09/01/2023 (Originally 09/23/2020)   INFLUENZA VACCINE  12/06/2023 (Originally 04/08/2023)   MAMMOGRAM  07/15/2024   DTaP/Tdap/Td (3 - Td or Tdap) 02/10/2033   HPV VACCINES  Aged Out    Reviewed most recent labs including CBC, CMP, lipid panel, A1C, TSH, and vitamin D. All within normal limits/stable from last check other than A1c increased to 5.8, BUN/creatinine ratio slightly elevated at 25, LDL elevated but stable at 152 with slightly above average risk score. Cervical cancer screening was completed last week at [facility], requesting results. Patient had colonoscopy completed in 2022 at [facility], requesting results. Declines flu vaccine, she will think about the shingles vaccine.  Discussed health benefits of physical activity, and encouraged her to engage in regular exercise appropriate for her age and condition.  Wellness examination  Moderate mixed hyperlipidemia not requiring statin therapy Assessment & Plan: Last lipid panel: LDL 152, HDL 47, triglycerides 107. The 10-year ASCVD risk score (Arnett DK, et al., 2019) is: 2.5%.  Continue nonpharmacologic management with routine physical activity and  limiting trans and saturated fats.  Will continue to monitor periodically.  Orders: -     Lipid panel; Future  Vitamin D deficiency -     VITAMIN D 25 Hydroxy (Vit-D Deficiency, Fractures); Future  Vitamin B12 deficiency -     Vitamin B12; Future  Screening for viral disease -     Hepatitis C antibody; Future -     HIV Antibody (routine testing w rflx); Future    Return in about 6 months (around 02/14/2024) for follow-up for cholesterol, vit D/B12, fasting blood work 1 week before.     Melida Quitter, PA

## 2023-08-16 NOTE — Patient Instructions (Signed)
Please let me know if anything comes up between now and your next appointment.  Otherwise keep doing what you are doing and we will recheck your vitamin levels and cholesterol levels in about 6 months!  Things to do to keep yourself healthy! - Exercise at least 30-45 minutes a day, 3-4 days a week.  - Eat a low-fat diet with lots of fruits and vegetables, up to 7-9 servings per day.  - Seatbelts can save your life. Wear them always.  - Smoke detectors on every level of your home, check batteries every year.  - Eye Doctor: have an eye exam every 1-2 years, even if you do not wear glasses or contacts. - Safe sex: if you may be exposed to STDs, use a condom.  - Alcohol: If you drink, do it moderately, less than 2 drinks per day.  - Health Care Power of Attorney: choose someone to speak for you if you are not able.  - Depression and anxiety are common in our stressful world. If you're feeling stressed, down, or like you're losing interest in things you normally enjoy, please come in for a visit.  - Violence: If anyone is threatening or hurting you, please call immediately.  Everyone deserves to be safe and loved in all of their relationships.

## 2023-09-14 ENCOUNTER — Ambulatory Visit
Admission: RE | Admit: 2023-09-14 | Discharge: 2023-09-14 | Disposition: A | Discharge status: Home / Self Care | Payer: 59 | Source: Ambulatory Visit | Attending: Obstetrics and Gynecology | Admitting: Obstetrics and Gynecology

## 2023-09-14 DIAGNOSIS — Z1231 Encounter for screening mammogram for malignant neoplasm of breast: Secondary | ICD-10-CM

## 2023-10-14 ENCOUNTER — Encounter: Payer: Self-pay | Admitting: Family Medicine

## 2023-10-15 ENCOUNTER — Encounter: Payer: Self-pay | Admitting: Family Medicine

## 2024-02-07 ENCOUNTER — Other Ambulatory Visit: Payer: BC Managed Care – PPO

## 2024-02-14 ENCOUNTER — Ambulatory Visit: Payer: BC Managed Care – PPO | Admitting: Family Medicine

## 2024-06-13 ENCOUNTER — Other Ambulatory Visit: Payer: Self-pay | Admitting: Obstetrics and Gynecology

## 2024-06-13 DIAGNOSIS — Z1231 Encounter for screening mammogram for malignant neoplasm of breast: Secondary | ICD-10-CM

## 2024-09-15 ENCOUNTER — Ambulatory Visit
Admission: RE | Admit: 2024-09-15 | Discharge: 2024-09-15 | Disposition: A | Source: Ambulatory Visit | Attending: Obstetrics and Gynecology | Admitting: Obstetrics and Gynecology

## 2024-09-15 DIAGNOSIS — Z1231 Encounter for screening mammogram for malignant neoplasm of breast: Secondary | ICD-10-CM
